# Patient Record
Sex: Male | Born: 1955 | Race: White | Hispanic: No | Marital: Single | Smoking: Never smoker
Health system: Southern US, Community
[De-identification: ages and names within clinical notes are randomized; demographics above are authoritative.]

## PROBLEM LIST (undated history)

## (undated) DIAGNOSIS — K5792 Diverticulitis of intestine, part unspecified, without perforation or abscess without bleeding: Secondary | ICD-10-CM

## (undated) DIAGNOSIS — M109 Gout, unspecified: Secondary | ICD-10-CM

## (undated) DIAGNOSIS — I10 Essential (primary) hypertension: Secondary | ICD-10-CM

## (undated) DIAGNOSIS — K659 Peritonitis, unspecified: Secondary | ICD-10-CM

## (undated) HISTORY — PX: OTHER SURGICAL HISTORY: SHX169

## (undated) HISTORY — PX: ABDOMINAL SURGERY: SHX537

## (undated) HISTORY — PX: REVISION COLOSTOMY: SHX1039

## (undated) HISTORY — PX: COLOSTOMY: SHX63

---

## 2004-12-24 ENCOUNTER — Ambulatory Visit (HOSPITAL_COMMUNITY): Admission: RE | Admit: 2004-12-24 | Discharge: 2004-12-24 | Payer: Self-pay | Admitting: Gastroenterology

## 2012-01-26 ENCOUNTER — Other Ambulatory Visit: Payer: Self-pay | Admitting: Family Medicine

## 2012-01-27 ENCOUNTER — Ambulatory Visit
Admission: RE | Admit: 2012-01-27 | Discharge: 2012-01-27 | Disposition: A | Discharge status: Home / Self Care | Payer: PRIVATE HEALTH INSURANCE | Source: Ambulatory Visit | Attending: Family Medicine | Admitting: Family Medicine

## 2012-01-27 MED ORDER — IOHEXOL 300 MG/ML  SOLN
100.0000 mL | Freq: Once | INTRAMUSCULAR | Status: AC | PRN
  Administered 2012-01-27: 125 mL via INTRAVENOUS

## 2012-02-07 ENCOUNTER — Inpatient Hospital Stay (HOSPITAL_COMMUNITY)
Admission: EM | Admit: 2012-02-07 | Discharge: 2012-02-11 | DRG: 603 | Disposition: A | Discharge status: Home Health | Payer: PRIVATE HEALTH INSURANCE | Attending: Surgery | Admitting: Surgery

## 2012-02-07 ENCOUNTER — Observation Stay (HOSPITAL_COMMUNITY): Payer: PRIVATE HEALTH INSURANCE

## 2012-02-07 ENCOUNTER — Encounter (HOSPITAL_COMMUNITY): Payer: Self-pay | Admitting: *Deleted

## 2012-02-07 DIAGNOSIS — T148XXA Other injury of unspecified body region, initial encounter: Secondary | ICD-10-CM

## 2012-02-07 DIAGNOSIS — Z9049 Acquired absence of other specified parts of digestive tract: Secondary | ICD-10-CM

## 2012-02-07 DIAGNOSIS — L03319 Cellulitis of trunk, unspecified: Secondary | ICD-10-CM

## 2012-02-07 DIAGNOSIS — Z79899 Other long term (current) drug therapy: Secondary | ICD-10-CM

## 2012-02-07 DIAGNOSIS — M109 Gout, unspecified: Secondary | ICD-10-CM

## 2012-02-07 DIAGNOSIS — I10 Essential (primary) hypertension: Secondary | ICD-10-CM

## 2012-02-07 DIAGNOSIS — L02219 Cutaneous abscess of trunk, unspecified: Principal | ICD-10-CM | POA: Diagnosis present

## 2012-02-07 DIAGNOSIS — K632 Fistula of intestine: Secondary | ICD-10-CM | POA: Diagnosis present

## 2012-02-07 DIAGNOSIS — Z9889 Other specified postprocedural states: Secondary | ICD-10-CM

## 2012-02-07 DIAGNOSIS — Z8719 Personal history of other diseases of the digestive system: Secondary | ICD-10-CM

## 2012-02-07 HISTORY — DX: Diverticulitis of intestine, part unspecified, without perforation or abscess without bleeding: K57.92

## 2012-02-07 HISTORY — DX: Gout, unspecified: M10.9

## 2012-02-07 HISTORY — DX: Essential (primary) hypertension: I10

## 2012-02-07 HISTORY — DX: Peritonitis, unspecified: K65.9

## 2012-02-07 LAB — BASIC METABOLIC PANEL
Chloride: 100 mEq/L (ref 96–112)
GFR calc Af Amer: >90 mL/min (ref >90)
GFR calc non Af Amer: >90 mL/min (ref >90)
Glucose, Bld: 94 mg/dL (ref 70–99)
Potassium: 4.6 mEq/L (ref 3.5–5.1)
Sodium: 138 mEq/L (ref 135–145)

## 2012-02-07 LAB — DIFFERENTIAL
Basophils Relative: 1 % (ref 0–1)
Lymphocytes Relative: 27 % (ref 12–46)
Lymphs Abs: 1.7 10*3/uL (ref 0.7–4.0)
Monocytes Relative: 10 % (ref 3–12)
Neutro Abs: 3.7 10*3/uL (ref 1.7–7.7)
Neutrophils Relative %: 58 % (ref 43–77)

## 2012-02-07 LAB — URINALYSIS, ROUTINE W REFLEX MICROSCOPIC
Glucose, UA: NEGATIVE mg/dL
Hgb urine dipstick: NEGATIVE
Specific Gravity, Urine: 1.019 (ref 1.005–1.030)
Urobilinogen, UA: 1 mg/dL (ref 0.0–1.0)
pH: 7 (ref 5.0–8.0)

## 2012-02-07 LAB — CBC
HCT: 30.4 % — ABNORMAL LOW (ref 39.0–52.0)
Hemoglobin: 10.8 g/dL — ABNORMAL LOW (ref 13.0–17.0)
RBC: 3.2 MIL/uL — ABNORMAL LOW (ref 4.22–5.81)
WBC: 6.3 10*3/uL (ref 4.0–10.5)

## 2012-02-07 MED ORDER — SODIUM CHLORIDE 0.9 % IV SOLN
3.0000 g | Freq: Once | INTRAVENOUS | Status: AC
  Administered 2012-02-07: 3 g via INTRAVENOUS
  Filled 2012-02-07: qty 3

## 2012-02-07 MED ORDER — ACETAMINOPHEN 325 MG PO TABS: 650.0000 mg | ORAL_TABLET | Freq: Four times a day (QID) | ORAL | Status: DC | PRN

## 2012-02-07 MED ORDER — DIPHENHYDRAMINE HCL 12.5 MG/5ML PO ELIX: 12.5000 mg | ORAL_SOLUTION | Freq: Four times a day (QID) | ORAL | Status: DC | PRN

## 2012-02-07 MED ORDER — ALPRAZOLAM 0.25 MG PO TABS
0.2500 mg | ORAL_TABLET | Freq: Every evening | ORAL | Status: DC | PRN
  Administered 2012-02-10 – 2012-02-11 (×2): 0.25 mg via ORAL
  Filled 2012-02-07 (×2): qty 1

## 2012-02-07 MED ORDER — OXYCODONE HCL 5 MG PO TABS
5.0000 mg | ORAL_TABLET | ORAL | Status: DC | PRN
  Administered 2012-02-08 – 2012-02-11 (×7): 5 mg via ORAL
  Filled 2012-02-07 (×6): qty 1

## 2012-02-07 MED ORDER — METOPROLOL SUCCINATE ER 50 MG PO TB24
50.0000 mg | ORAL_TABLET | Freq: Every day | ORAL | Status: DC
  Administered 2012-02-08 – 2012-02-09 (×2): 50 mg via ORAL
  Filled 2012-02-07 (×2): qty 1

## 2012-02-07 MED ORDER — DIPHENHYDRAMINE HCL 50 MG/ML IJ SOLN
12.5000 mg | Freq: Four times a day (QID) | INTRAMUSCULAR | Status: DC | PRN
  Administered 2012-02-08: 25 mg via INTRAVENOUS
  Filled 2012-02-07: qty 1

## 2012-02-07 MED ORDER — HEPARIN SODIUM (PORCINE) 5000 UNIT/ML IJ SOLN
5000.0000 [IU] | Freq: Three times a day (TID) | INTRAMUSCULAR | Status: DC
  Administered 2012-02-07 – 2012-02-09 (×6): 5000 [IU] via SUBCUTANEOUS
  Filled 2012-02-07 (×8): qty 1

## 2012-02-07 MED ORDER — HYDROMORPHONE HCL PF 1 MG/ML IJ SOLN
1.0000 mg | Freq: Once | INTRAMUSCULAR | Status: AC
  Administered 2012-02-07: 1 mg via INTRAVENOUS
  Filled 2012-02-07: qty 1

## 2012-02-07 MED ORDER — ONDANSETRON HCL 4 MG/2ML IJ SOLN: 4.0000 mg | Freq: Four times a day (QID) | INTRAMUSCULAR | Status: DC | PRN

## 2012-02-07 MED ORDER — ACETAMINOPHEN 650 MG RE SUPP: 650.0000 mg | Freq: Four times a day (QID) | RECTAL | Status: DC | PRN

## 2012-02-07 MED ORDER — PIPERACILLIN-TAZOBACTAM 3.375 G IVPB
3.3750 g | Freq: Three times a day (TID) | INTRAVENOUS | Status: DC
  Administered 2012-02-07 – 2012-02-11 (×11): 3.375 g via INTRAVENOUS
  Filled 2012-02-07 (×13): qty 50

## 2012-02-07 MED ORDER — KCL IN DEXTROSE-NACL 20-5-0.45 MEQ/L-%-% IV SOLN
INTRAVENOUS | Status: DC
  Administered 2012-02-07: 1000 mL via INTRAVENOUS
  Filled 2012-02-07 (×5): qty 1000

## 2012-02-07 MED ORDER — OXYCODONE-ACETAMINOPHEN 10-325 MG PO TABS: 1.0000 | ORAL_TABLET | ORAL | Status: DC | PRN

## 2012-02-07 MED ORDER — ALLOPURINOL 100 MG PO TABS
100.0000 mg | ORAL_TABLET | Freq: Every day | ORAL | Status: DC
  Administered 2012-02-08 – 2012-02-09 (×2): 100 mg via ORAL
  Filled 2012-02-07 (×2): qty 1

## 2012-02-07 MED ORDER — ONDANSETRON HCL 4 MG/2ML IJ SOLN: 4.0000 mg | Freq: Once | INTRAMUSCULAR | Status: DC

## 2012-02-07 MED ORDER — LISINOPRIL 20 MG PO TABS
20.0000 mg | ORAL_TABLET | Freq: Every day | ORAL | Status: DC
  Administered 2012-02-08 – 2012-02-09 (×2): 20 mg via ORAL
  Filled 2012-02-07 (×2): qty 1

## 2012-02-07 MED ORDER — HYDROMORPHONE HCL PF 1 MG/ML IJ SOLN: 1.0000 mg | Freq: Once | INTRAMUSCULAR | Status: AC

## 2012-02-07 MED ORDER — OXYCODONE-ACETAMINOPHEN 5-325 MG PO TABS
1.0000 | ORAL_TABLET | ORAL | Status: DC | PRN
  Administered 2012-02-08 – 2012-02-11 (×8): 1 via ORAL
  Filled 2012-02-07 (×7): qty 1

## 2012-02-07 MED ORDER — HYDROMORPHONE HCL PF 1 MG/ML IJ SOLN
1.0000 mg | INTRAMUSCULAR | Status: DC | PRN
  Administered 2012-02-07 – 2012-02-09 (×11): 1 mg via INTRAVENOUS
  Filled 2012-02-07 (×11): qty 1

## 2012-02-07 NOTE — ED Provider Notes (Signed)
History     CSN: [id number]  Arrival date & time 02/07/12  1452   First MD Initiated Contact with Patient 02/07/12 1702      Chief Complaint  Patient presents with  . Wound Infection    (Consider location/radiation/quality/duration/timing/severity/associated sxs/prior treatment) The history is provided by the patient and the spouse. No language interpreter was used.  cc:  Patient is here today complaining of a left lower abdominal wound infection. Patient has had several abdominal surgeries including surgery for diverticulitis with colostomy and hernia surgeries. Patient had his surgery done in [place] per Dr. Bascom Levels at [facility] the area in question. Patient states that the left lower quadrant scarring started draining 3 days ago and the CT a week ago shows that there is a mass in the scar tissue. There is area of cellulitis around the old colostomy site. States that his PCP Dr. Clarene Duke and Daleen Squibb has been managing his infections for the last 4 years. States that he got a shot of something in the office today antibiotic but not sure what was in the shot.  Patient's wife states that Dr. Clarene Duke called and talked to Dr. Daphine Deutscher to come in the ER and put a drain in the infected area.    Past Medical History  Diagnosis Date  . Diverticulitis   . Hypertension   . Gout     Past Surgical History  Procedure Date  . Revision colostomy     No family history on file.  History  Substance Use Topics  . Smoking status: Never Smoker   . Smokeless tobacco: Not on file  . Alcohol Use: 7.2 oz/week    12 Cans of beer per week      Review of Systems  Constitutional: Negative.  Negative for fever.  HENT: Negative.   Eyes: Negative.   Respiratory: Negative.   Cardiovascular: Negative.   Gastrointestinal: Positive for nausea. Negative for vomiting and diarrhea.       RLQ wound/cellulites   Neurological: Negative.   Psychiatric/Behavioral: Negative.   All other  systems reviewed and are negative.    Allergies  Review of patient's allergies indicates no known allergies.  Home Medications   Current Outpatient Rx  Name Route Sig Dispense Refill  . ALLOPURINOL 300 MG PO TABS Oral Take 100 mg by mouth daily.    Marland Kitchen ALPRAZOLAM 0.25 MG PO TABS Oral Take 0.25 mg by mouth at bedtime as needed. Anxiety    . LISINOPRIL 20 MG PO TABS Oral Take 20 mg by mouth daily.    Marland Kitchen METOPROLOL SUCCINATE ER 50 MG PO TB24 Oral Take 50 mg by mouth daily. Take with or immediately following a meal.    . ADULT MULTIVITAMIN W/MINERALS CH Oral Take 1 tablet by mouth daily.    . OXYCODONE-ACETAMINOPHEN 10-325 MG PO TABS Oral Take 1 tablet by mouth every 4 (four) hours as needed. Pain      BP 121/81  Pulse 76  Temp(Src) 97.5 F (36.4 C) (Oral)  Resp 16  Wt 230 lb (104.327 kg)  SpO2 97%  Physical Exam  Nursing note and vitals reviewed. Constitutional: He is oriented to person, place, and time. He appears well-developed and well-nourished.  HENT:  Head: Normocephalic.  Eyes: Conjunctivae and EOM are normal. Pupils are equal, round, and reactive to light.  Neck: Normal range of motion. Neck supple.  Cardiovascular: Normal rate.   Pulmonary/Chest: Effort normal and breath sounds normal.  Abdominal: Soft. Bowel sounds are normal. He exhibits  mass. He exhibits no distension. There is tenderness.  Musculoskeletal: Normal range of motion.  Neurological: He is alert and oriented to person, place, and time.  Skin: Skin is warm and dry.       RLQ wound drainage cellulitis from old colostomy site foul odor  Psychiatric: He has a normal mood and affect.    ED Course  Procedures (including critical care time) 6:18 PM Shared visit with Dr. Juleen China who spoke with Dr. Daphine Deutscher. The patient will be seen in the ER by Dr. Daphine Deutscher on for surgery in the next hour.  Will culture wound.  Cellulitis outlined.      Labs Reviewed  CBC - Abnormal; Notable for the following:    RBC 3.20 (*)     Hemoglobin 10.8 (*)    HCT 30.4 (*)    All other components within normal limits  DIFFERENTIAL  URINALYSIS, ROUTINE W REFLEX MICROSCOPIC  BASIC METABOLIC PANEL   No results found.   No diagnosis found.    MDM  LLQ wound that is chronic and worsened 3 days ago.  Serosanguinous drainage cultured.  Dr. Daphine Deutscher to see patient in ER for possible procedure.  Unasyn 3gm iv in the ER.  Dilaudid for pain.  Odorous.  Will put in admission orders if requested.    Labs Reviewed  CBC - Abnormal; Notable for the following:    RBC 3.20 (*)    Hemoglobin 10.8 (*)    HCT 30.4 (*)    All other components within normal limits  DIFFERENTIAL  BASIC METABOLIC PANEL  URINALYSIS, ROUTINE W REFLEX MICROSCOPIC  WOUND CULTURE          Remi Haggard, NP 02/07/12 2128

## 2012-02-07 NOTE — H&P (Signed)
Chief Complaint:  Left lower quadrant cellulitis at site of previous ostomy  History of Present Illness:  [name] is an 56 y.o. male who is followed by Dr. Rosanne Ashing little in [place] and who has a history of diverticulitis in years past treated with a Hartmann procedure followed by closure of his ostomy and then following with closure of his ostomy hernia with mesh. This last procedure was done down at [facility]. This was several years ago.  And may the patient started noticing a mass at the site of his ostomy that was firm and which he was seen by Dr. Clarene Duke and a CT scan was performed. This was reviewed by me and I would tend to disagree with the findings. He does have small bowel with contrast intimately associated with this region and in light of the subsequent behavior with what now is cellulitis and abscess I am concerned he may be developing a fistula. I probed this wound in the ED and it went in about an inch and was opened and contained frank pus.  The possibility cultured and Dr. Fredirick Maudlin office earlier. Eyes discussed options with Mr. Mellin and recommended IV antibiotics repeat CT scan to see if we can demonstrate any early fistula formation to the mesh.  Past Medical History  Diagnosis Date  . Diverticulitis   . Hypertension   . Gout     Past Surgical History  Procedure Date  . Revision colostomy     Current Facility-Administered Medications  Medication Dose Route Frequency Provider Last Rate Last Dose  . Ampicillin-Sulbactam (UNASYN) 3 g in sodium chloride 0.9 % 100 mL IVPB  3 g Intravenous Once Remi Haggard, NP   3 g at 02/07/12 1757  . HYDROmorphone (DILAUDID) injection 1 mg  1 mg Intravenous Once Alba Cory, MD   1 mg at 02/07/12 1755  . HYDROmorphone (DILAUDID) injection 1 mg  1 mg Intravenous Once Remi Haggard, NP      . ondansetron [facility]) injection 4 mg  4 mg Intravenous Once Remi Haggard, NP       Current Outpatient Prescriptions  Medication  Sig Dispense Refill  . allopurinol (ZYLOPRIM) 300 MG tablet Take 100 mg by mouth daily.      Marland Kitchen ALPRAZolam (XANAX) 0.25 MG tablet Take 0.25 mg by mouth at bedtime as needed. Anxiety      . lisinopril (PRINIVIL,ZESTRIL) 20 MG tablet Take 20 mg by mouth daily.      . metoprolol succinate (TOPROL-XL) 50 MG 24 hr tablet Take 50 mg by mouth daily. Take with or immediately following a meal.      . Multiple Vitamin (MULTIVITAMIN WITH MINERALS) TABS Take 1 tablet by mouth daily.      Marland Kitchen oxyCODONE-acetaminophen (PERCOCET) 10-325 MG per tablet Take 1 tablet by mouth every 4 (four) hours as needed. Pain       Review of patient's allergies indicates no known allergies. No family history on file. Social History:   reports that he has never smoked. He does not have any smokeless tobacco history on file. He reports that he drinks about 7.2 ounces of alcohol per week. He reports that he does not use illicit drugs.   REVIEW OF SYSTEMS - PERTINENT POSITIVES ONLY: Noncontributory  Physical Exam:   Blood pressure 121/81, pulse 76, temperature 97.5 F (36.4 C), temperature source Oral, resp. rate 16, weight 230 lb (104.327 kg), SpO2 97.00%. There is no height on file to calculate BMI.  Gen:  WDWN WHITE  male NAD  Neurological: Alert and oriented to person, place, and time. Motor and sensory function is grossly intact  Head: Normocephalic and atraumatic.  Eyes: Conjunctivae are normal. Pupils are equal, round, and reactive to light. No scleral icterus.  Neck: Normal range of motion. Neck supple. No tracheal deviation or thyromegaly present.  Cardiovascular:  SR without murmurs or gallops.  No carotid bruits Respiratory: Effort normal.  No respiratory distress. No chest wall tenderness. Breath sounds normal.  No wheezes, rales or rhonchi.  Abdomen:  There is an area of edema in the old transverse incision an area of cellulitis surrounding that. Laterally there are 2 areas of this is opened and did appear to be  draining well. No crepitus is noted around this and it does not appear to be any gas coming out of his the present time. GU: Musculoskeletal: Normal range of motion. Extremities are nontender. No cyanosis, edema or clubbing noted Lymphadenopathy: No cervical, preauricular, postauricular or axillary adenopathy is present Skin: Skin is warm and dry. No rash noted. No diaphoresis. No erythema. No pallor. Pscyh: Normal mood and affect. Behavior is normal. Judgment and thought content normal.   LABORATORY RESULTS: Results for orders placed during the hospital encounter of 02/07/12 (from the past 48 hour(s))  CBC     Status: Abnormal   Collection Time   02/07/12  5:05 PM      Component Value Range Comment   WBC 6.3  4.0 - 10.5 (K/uL)    RBC 3.20 (*) 4.22 - 5.81 (MIL/uL)    Hemoglobin 10.8 (*) 13.0 - 17.0 (g/dL)    HCT 16.1 (*) 09.6 - 52.0 (%)    MCV 95.0  78.0 - 100.0 (fL)    MCH 33.8  26.0 - 34.0 (pg)    MCHC 35.5  30.0 - 36.0 (g/dL)    RDW 04.5  40.9 - 81.1 (%)    Platelets 249  150 - 400 (K/uL)   DIFFERENTIAL     Status: Normal   Collection Time   02/07/12  5:05 PM      Component Value Range Comment   Neutrophils Relative 58  43 - 77 (%)    Neutro Abs 3.7  1.7 - 7.7 (K/uL)    Lymphocytes Relative 27  12 - 46 (%)    Lymphs Abs 1.7  0.7 - 4.0 (K/uL)    Monocytes Relative 10  3 - 12 (%)    Monocytes Absolute 0.6  0.1 - 1.0 (K/uL)    Eosinophils Relative 4  0 - 5 (%)    Eosinophils Absolute 0.3  0.0 - 0.7 (K/uL)    Basophils Relative 1  0 - 1 (%)    Basophils Absolute 0.0  0.0 - 0.1 (K/uL)   BASIC METABOLIC PANEL     Status: Normal   Collection Time   02/07/12  5:05 PM      Component Value Range Comment   Sodium 138  135 - 145 (mEq/L)    Potassium 4.6  3.5 - 5.1 (mEq/L)    Chloride 100  96 - 112 (mEq/L)    CO2 29  19 - 32 (mEq/L)    Glucose, Bld 94  70 - 99 (mg/dL)    BUN 16  6 - 23 (mg/dL)    Creatinine, Ser 9.14  0.50 - 1.35 (mg/dL)    Calcium 9.3  8.4 - 10.5 (mg/dL)    GFR  calc non Af Amer >90  >90 (mL/min)    GFR calc Af  Amer >90  >90 (mL/min)     RADIOLOGY RESULTS: No results found.  Problem List: Patient Active Problem List  Diagnoses  . H/O diverticulitis of colon-prior colectomcy, Hartman procedure, closure and repair of ostomy hernia with mesh in [facility]  . Gout  . Hypertension    Assessment & Plan: His white count is only 6300 and with no left shift. He is afebrile and not tachycardic. Despite this this area is red and infected. Will admit for IV antibiotics and repeat CT scan abdomen pelvis.    Matt B. Daphine Deutscher, MD, [facility] [phone] beeper [phone]  02/07/2012 7:35 PM

## 2012-02-07 NOTE — ED Notes (Signed)
Pt states "have had a lot of abdominal surgery, noticed a lot of red & swelling, had a CT done, it said there was a mass, no abscess"; pt presents with yellow drainage and redness to left lateral abdomen.

## 2012-02-08 ENCOUNTER — Observation Stay (HOSPITAL_COMMUNITY): Payer: PRIVATE HEALTH INSURANCE

## 2012-02-08 LAB — CBC
Hemoglobin: 10.2 g/dL — ABNORMAL LOW (ref 13.0–17.0)
MCH: 32.9 pg (ref 26.0–34.0)
RBC: 3.1 MIL/uL — ABNORMAL LOW (ref 4.22–5.81)

## 2012-02-08 MED ORDER — IOHEXOL 300 MG/ML  SOLN
100.0000 mL | Freq: Once | INTRAMUSCULAR | Status: AC | PRN
  Administered 2012-02-08: 100 mL via INTRAVENOUS

## 2012-02-08 NOTE — Progress Notes (Addendum)
  Subjective: Appear comfortable in bed finished contrast.  Objective: Vital signs in last 24 hours: Temp:  [97.4 F (36.3 C)-98.1 F (36.7 C)] 97.4 F (36.3 C) (06/11 0746) Pulse Rate:  [58-76] 63  (06/11 0746) Resp:  [16-18] 16  (06/11 0746) BP: (121-160)/(81-93) 154/93 mmHg (06/11 0746) SpO2:  [96 %-100 %] 96 % (06/11 0746) Weight:  [104.327 kg (230 lb)] 104.327 kg (230 lb) (06/11 0130) Last BM Date: 02/07/12  Afebrile, BP up some, WBC is normal  Intake/Output from previous day:   Intake/Output this shift: Total I/O In: -  Out: 500 [Urine:500]  General appearance: alert, cooperative and no distress GI: cellulitis with open area in the mid portion of the old ostomy site, with purulent drainage.  Lab Results:   Basename 02/08/12 0411 02/07/12 1705  WBC 5.5 6.3  HGB 10.2* 10.8*  HCT 29.7* 30.4*  PLT 210 249    BMET  Basename 02/07/12 1705  NA 138  K 4.6  CL 100  CO2 29  GLUCOSE 94  BUN 16  CREATININE 0.82  CALCIUM 9.3   PT/INR No results found for this basename: LABPROT:2,INR:2 in the last 72 hours  No results found for this basename: AST:5,ALT:5,ALKPHOS:5,BILITOT:5,PROT:5,ALBUMIN:5 in the last 168 hours   Lipase  No results found for this basename: lipase     Studies/Results: No results found.  Medications:    . allopurinol  100 mg Oral Daily  . ampicillin-sulbactam (UNASYN) IV  3 g Intravenous Once  . heparin  5,000 Units Subcutaneous Q8H  .  HYDROmorphone (DILAUDID) injection  1 mg Intravenous Once  .  HYDROmorphone (DILAUDID) injection  1 mg Intravenous Once  .  HYDROmorphone (DILAUDID) injection  1 mg Intravenous Once  . lisinopril  20 mg Oral Daily  . metoprolol succinate  50 mg Oral Daily  . ondansetron (ZOFRAN) IV  4 mg Intravenous Once  . piperacillin-tazobactam (ZOSYN)  IV  3.375 g Intravenous Q8H    Assessment/Plan Possible abscess at old Ostomy site Hx of diverticulitis/Hartmans/closure of ostomy/ostomy  Hernia with mesh  repair. [facility]. Hypertension  Gout  Plan: CT is pending, it looks like it may need to be opened.      LOS: 1 day    [name],[name] 02/08/2012  PCP - Dr. Shela Commons. Little CT scan - questionable fistula.  Left inguinal hernia. The patient had a colectomy with ostomy in 2005.  He had a reversal of the ostomy the same year.  He then had a hernia at the ostomy which was fixed with mesh (?laparoscopically?) in 2006.  All surgery was done in [facility] [place]. The surgery was done by Dr. Tonette Bihari.  We have requested records from [facility]. His life partner, [name], is in the room. Patient with 7+ cm area of cellulitis at the area of his colostomy in the left abdomen. He will need I&D left abdomen and/or local exploration. I gave patient copy of CT scan and explained possible fistula secondary to mesh (as worst possibility).  Scott Kin, MD, [facility] Pager: [phone] Office phone:  [phone]

## 2012-02-08 NOTE — ED Provider Notes (Signed)
Medical screening examination/treatment/procedure(s) were conducted as a shared visit with non-physician practitioner(s) and myself.  I personally evaluated the patient during the encounter.  Patient with abdominal wound to left lower quadrant surrounding cellulitis. Recent CT on May 30 for mass below this. No drainable collection there is no evidence of incarceration. Given history of prior resection consider possible fistula. Surgery was consult who evaluated patient in the emergency room and admitted.  Raeford Razor, MD 02/08/12 [phone]

## 2012-02-08 NOTE — Progress Notes (Signed)
UR complete 

## 2012-02-08 NOTE — Progress Notes (Signed)
An increase in drainage noted and dressing changed a second time  (wet-to-dry with Normal Saline). Dressing change was tolerated by patient.

## 2012-02-08 NOTE — Progress Notes (Signed)
INITIAL ADULT NUTRITION ASSESSMENT Date: 02/08/2012   Time: 1:27 PM Reason for Assessment: Nutrition risk   ASSESSMENT: Male 56 y.o.  Dx: Left lower quadrant cellulitis at site of previous ostomy  Food/Nutrition Related Hx: Pt with history of colostomy with takedown. Pt reports great appetite PTA, eating 3 meals/day. Pt aware of what foods he can't eat that can irritate his diverticulitis and has also been following a low sodium diet in an effort to improve his blood pressure. He reports 10 pound intentional weight loss in the past 6 weeks r/t eating healthier. Pt denies any diarrhea or constipation PTA. CT of abdomen/pelvis today showed possibility of tiny fistula. Awaiting decision on surgery.   Hx:  Past Medical History  Diagnosis Date  . Diverticulitis   . Hypertension   . Gout   . Peritonitis    Past Surgical History  Procedure Date  . Revision colostomy   . Colostomy   . Diverticulum surgery     x2 , with colostomy on 2nd surgery  . Abdominal surgery     stoma herniated and had surgery on it     Related Meds:  Scheduled Meds:   . allopurinol  100 mg Oral Daily  . ampicillin-sulbactam (UNASYN) IV  3 g Intravenous Once  . heparin  5,000 Units Subcutaneous Q8H  .  HYDROmorphone (DILAUDID) injection  1 mg Intravenous Once  .  HYDROmorphone (DILAUDID) injection  1 mg Intravenous Once  .  HYDROmorphone (DILAUDID) injection  1 mg Intravenous Once  . lisinopril  20 mg Oral Daily  . metoprolol succinate  50 mg Oral Daily  . ondansetron (ZOFRAN) IV  4 mg Intravenous Once  . piperacillin-tazobactam (ZOSYN)  IV  3.375 g Intravenous Q8H   Continuous Infusions:   . dextrose 5 % and 0.45 % NaCl with KCl 20 mEq/L 1,000 mL (02/07/12 2214)   PRN Meds:.acetaminophen, acetaminophen, ALPRAZolam, diphenhydrAMINE, diphenhydrAMINE, HYDROmorphone (DILAUDID) injection, iohexol, ondansetron, oxyCODONE, oxyCODONE-acetaminophen, DISCONTD: oxyCODONE-acetaminophen  Ht: 185.4 cm (6'  1'')  Wt: 230 lb (104.327 kg)  Ideal Wt: 184 lb % Ideal Wt: 125  Usual Wt: 240 lb % Usual Wt: 96  Body mass index is 30.34 kg/(m^2). Class I obesity   Labs:  CMP     Component Value Date/Time   NA 138 02/07/2012 1705   K 4.6 02/07/2012 1705   CL 100 02/07/2012 1705   CO2 29 02/07/2012 1705   GLUCOSE 94 02/07/2012 1705   BUN 16 02/07/2012 1705   CREATININE 0.82 02/07/2012 1705   CALCIUM 9.3 02/07/2012 1705   GFRNONAA >90 02/07/2012 1705   GFRAA >90 02/07/2012 1705    Intake/Output Summary (Last 24 hours) at 02/08/12 1335 Last data filed at 02/08/12 0730  Gross per 24 hour  Intake      0 ml  Output    500 ml  Net   -500 ml   Last BM - 02/07/12  Diet Order:  NPO   IVF:    dextrose 5 % and 0.45 % NaCl with KCl 20 mEq/L Last Rate: 1,000 mL (02/07/12 2214)    Estimated Nutritional Needs:   Kcal: 1610-9604 Protein: 85-100g  Fluid: 2.1-2.4L  NUTRITION DIAGNOSIS: -Inadequate oral intake (NI-2.1).  Status: Ongoing  RELATED TO: concern for fistula   AS EVIDENCE BY: NPO  MONITORING/EVALUATION(Goals): Advance diet as tolerated to regular diet   EDUCATION NEEDS: -No education needs identified at this time  INTERVENTION: Diet advancement per MD. Will monitor.   Dietitian #: [phone]  DOCUMENTATION CODES  Per approved criteria  -Obesity Unspecified    Marshall Cork 02/08/2012, 1:27 PM

## 2012-02-09 ENCOUNTER — Inpatient Hospital Stay (HOSPITAL_COMMUNITY): Payer: PRIVATE HEALTH INSURANCE | Admitting: *Deleted

## 2012-02-09 ENCOUNTER — Encounter (HOSPITAL_COMMUNITY): Payer: Self-pay | Admitting: *Deleted

## 2012-02-09 ENCOUNTER — Encounter (HOSPITAL_COMMUNITY): Admission: EM | Disposition: A | Discharge status: Home Health | Payer: Self-pay | Source: Home / Self Care

## 2012-02-09 HISTORY — PX: INCISE AND DRAIN ABCESS: PRO64

## 2012-02-09 LAB — SURGICAL PCR SCREEN
MRSA, PCR: NEGATIVE
Staphylococcus aureus: NEGATIVE

## 2012-02-09 SURGERY — INCISION AND DRAINAGE, ABSCESS
Anesthesia: General | Site: Abdomen | Wound class: Dirty or Infected

## 2012-02-09 MED ORDER — ACETAMINOPHEN 10 MG/ML IV SOLN
INTRAVENOUS | Status: DC | PRN
  Administered 2012-02-09: 1000 mg via INTRAVENOUS

## 2012-02-09 MED ORDER — HYDROMORPHONE HCL PF 1 MG/ML IJ SOLN
0.2500 mg | INTRAMUSCULAR | Status: DC | PRN
  Administered 2012-02-09 (×4): 0.5 mg via INTRAVENOUS

## 2012-02-09 MED ORDER — FENTANYL CITRATE 0.05 MG/ML IJ SOLN
INTRAMUSCULAR | Status: DC | PRN
  Administered 2012-02-09: 50 ug via INTRAVENOUS
  Administered 2012-02-09: 100 ug via INTRAVENOUS
  Administered 2012-02-09 (×2): 50 ug via INTRAVENOUS

## 2012-02-09 MED ORDER — LACTATED RINGERS IV SOLN
INTRAVENOUS | Status: DC | PRN
  Administered 2012-02-09 (×2): via INTRAVENOUS

## 2012-02-09 MED ORDER — CISATRACURIUM BESYLATE (PF) 10 MG/5ML IV SOLN
INTRAVENOUS | Status: DC | PRN
  Administered 2012-02-09: 4 mg via INTRAVENOUS

## 2012-02-09 MED ORDER — PROPOFOL 10 MG/ML IV EMUL
INTRAVENOUS | Status: DC | PRN
  Administered 2012-02-09: 200 mg via INTRAVENOUS

## 2012-02-09 MED ORDER — SUCCINYLCHOLINE CHLORIDE 20 MG/ML IJ SOLN
INTRAMUSCULAR | Status: DC | PRN
  Administered 2012-02-09: 120 mg via INTRAVENOUS

## 2012-02-09 MED ORDER — LACTATED RINGERS IV SOLN: INTRAVENOUS | Status: DC

## 2012-02-09 MED ORDER — ONDANSETRON HCL 4 MG/2ML IJ SOLN
INTRAMUSCULAR | Status: DC | PRN
  Administered 2012-02-09: 4 mg via INTRAVENOUS

## 2012-02-09 MED ORDER — MIDAZOLAM HCL 5 MG/5ML IJ SOLN
INTRAMUSCULAR | Status: DC | PRN
  Administered 2012-02-09 (×4): 1 mg via INTRAVENOUS

## 2012-02-09 MED ORDER — SUFENTANIL CITRATE 50 MCG/ML IV SOLN
INTRAVENOUS | Status: DC | PRN
  Administered 2012-02-09 (×2): 10 ug via INTRAVENOUS

## 2012-02-09 MED ORDER — LIDOCAINE HCL (CARDIAC) 20 MG/ML IV SOLN
INTRAVENOUS | Status: DC | PRN
  Administered 2012-02-09: 75 mg via INTRAVENOUS

## 2012-02-09 SURGICAL SUPPLY — 53 items
APPLICATOR COTTON TIP 6IN STRL (MISCELLANEOUS) IMPLANT
BANDAGE GAUZE ELAST BULKY 4 IN (GAUZE/BANDAGES/DRESSINGS) ×3 IMPLANT
BLADE EXTENDED COATED 6.5IN (ELECTRODE) IMPLANT
BLADE HEX COATED 2.75 (ELECTRODE) ×3 IMPLANT
BLADE SURG 15 STRL LF DISP TIS (BLADE) ×2 IMPLANT
BLADE SURG 15 STRL SS (BLADE) ×2
CANISTER SUCTION 2500CC (MISCELLANEOUS) ×3 IMPLANT
CLOTH BEACON ORANGE TIMEOUT ST (SAFETY) ×3 IMPLANT
COVER MAYO STAND STRL (DRAPES) IMPLANT
COVER SURGICAL LIGHT HANDLE (MISCELLANEOUS) ×3 IMPLANT
DECANTER SPIKE VIAL GLASS SM (MISCELLANEOUS) IMPLANT
DRAPE LAPAROSCOPIC ABDOMINAL (DRAPES) ×3 IMPLANT
DRAPE WARM FLUID 44X44 (DRAPE) IMPLANT
DRSG PAD ABDOMINAL 8X10 ST (GAUZE/BANDAGES/DRESSINGS) ×6 IMPLANT
ELECT CAUTERY BLADE 6.4 (BLADE) ×3 IMPLANT
ELECT REM PT RETURN 9FT ADLT (ELECTROSURGICAL) ×3
ELECTRODE REM PT RTRN 9FT ADLT (ELECTROSURGICAL) ×2 IMPLANT
GLOVE BIO SURGEON STRL SZ7.5 (GLOVE) ×3 IMPLANT
GLOVE BIOGEL PI IND STRL 7.0 (GLOVE) ×2 IMPLANT
GLOVE BIOGEL PI INDICATOR 7.0 (GLOVE) ×1
GLOVE SURG ORTHO 8.0 STRL STRW (GLOVE) ×3 IMPLANT
GOWN STRL NON-REIN LRG LVL3 (GOWN DISPOSABLE) ×6 IMPLANT
GOWN STRL REIN XL XLG (GOWN DISPOSABLE) ×6 IMPLANT
KIT BASIN OR (CUSTOM PROCEDURE TRAY) ×3 IMPLANT
NEEDLE HYPO 25X1 1.5 SAFETY (NEEDLE) IMPLANT
NS IRRIG 1000ML POUR BTL (IV SOLUTION) ×3 IMPLANT
PACK GENERAL/GYN (CUSTOM PROCEDURE TRAY) ×3 IMPLANT
PENCIL BUTTON HOLSTER BLD 10FT (ELECTRODE) ×3 IMPLANT
SPONGE GAUZE 4X4 12PLY (GAUZE/BANDAGES/DRESSINGS) ×3 IMPLANT
SPONGE LAP 18X18 X RAY DECT (DISPOSABLE) ×3 IMPLANT
STAPLER VISISTAT 35W (STAPLE) IMPLANT
SUCTION POOLE TIP (SUCTIONS) IMPLANT
SUT MNCRL AB 4-0 PS2 18 (SUTURE) IMPLANT
SUT NOV 1 T60/GS (SUTURE) IMPLANT
SUT SILK 2 0 (SUTURE)
SUT SILK 2 0 SH CR/8 (SUTURE) IMPLANT
SUT SILK 2-0 18XBRD TIE 12 (SUTURE) IMPLANT
SUT SILK 3 0 (SUTURE)
SUT SILK 3 0 SH CR/8 (SUTURE) IMPLANT
SUT SILK 3-0 18XBRD TIE 12 (SUTURE) IMPLANT
SUT VIC AB 3-0 SH 27 (SUTURE)
SUT VIC AB 3-0 SH 27XBRD (SUTURE) IMPLANT
SUT VICRYL 2 0 18  UND BR (SUTURE)
SUT VICRYL 2 0 18 UND BR (SUTURE) IMPLANT
SWAB COLLECTION DEVICE MRSA (MISCELLANEOUS) IMPLANT
SYR BULB 3OZ (MISCELLANEOUS) IMPLANT
SYR CONTROL 10ML LL (SYRINGE) IMPLANT
TOWEL OR 17X26 10 PK STRL BLUE (TOWEL DISPOSABLE) ×6 IMPLANT
TRAY FOLEY CATH 14FRSI W/METER (CATHETERS) IMPLANT
TUBE ANAEROBIC SPECIMEN COL (MISCELLANEOUS) IMPLANT
WATER STERILE IRR 1000ML POUR (IV SOLUTION) IMPLANT
WATER STERILE IRR 1500ML POUR (IV SOLUTION) IMPLANT
YANKAUER SUCT BULB TIP NO VENT (SUCTIONS) IMPLANT

## 2012-02-09 NOTE — Anesthesia Postprocedure Evaluation (Signed)
  Anesthesia Post-op Note  Patient: [name]  Procedure(s) Performed: Procedure(s) (LRB): INCISION AND DRAINAGE ABSCESS (Left)  Patient Location: PACU  Anesthesia Type: General  Level of Consciousness: awake and alert   Airway and Oxygen Therapy: Patient Spontanous Breathing  Post-op Pain: mild  Post-op Assessment: Post-op Vital signs reviewed, Patient's Cardiovascular Status Stable, Respiratory Function Stable, Patent Airway and No signs of Nausea or vomiting  Post-op Vital Signs: stable  Complications: No apparent anesthesia complications 

## 2012-02-09 NOTE — Progress Notes (Signed)
Patient ID: [name], male   DOB: April 29, 1956, 56 y.o.   MRN: [id number]  General Surgery - [facility] - Progress Note  Subjective: Patient comfortable.  No complaints.  On IV Zosyn.  Dressing dry.  Objective: Vital signs in last 24 hours: Temp:  [97.7 F (36.5 C)-97.9 F (36.6 C)] 97.7 F (36.5 C) (06/12 0600) Pulse Rate:  [58-70] 60  (06/12 0600) Resp:  [16-18] 18  (06/12 0600) BP: (109-149)/(71-87) 123/74 mmHg (06/12 0600) SpO2:  [96 %-98 %] 98 % (06/12 0600) Last BM Date: 02/07/12  Intake/Output from previous day: 06/11 0701 - 06/12 0700 In: 2562.5 [P.O.:480; I.V.:2082.5] Out: 2875 [Urine:2875]  Exam: HEENT - clear, not icteric Neck - soft Chest - clear bilaterally Cor - RRR, no murmur Abd - soft without distension; LLQ dressing with small drainage; punctate opening in skin; moderate cellutlitis Ext - no significant edema Neuro - grossly intact, no focal deficits  Lab Results:   Basename 02/08/12 0411 02/07/12 1705  WBC 5.5 6.3  HGB 10.2* 10.8*  HCT 29.7* 30.4*  PLT 210 249     Basename 02/07/12 1705  NA 138  K 4.6  CL 100  CO2 29  GLUCOSE 94  BUN 16  CREATININE 0.82  CALCIUM 9.3    Studies/Results: Ct Abdomen Pelvis W Contrast  02/08/2012  *RADIOLOGY REPORT*  Clinical Data: Prior ostomy closure, question fistula from small bowel to match, history diverticulitis  CT ABDOMEN AND PELVIS WITH CONTRAST  Technique:  Multidetector CT imaging of the abdomen and pelvis was performed following the standard protocol during bolus administration of intravenous contrast. Sagittal and coronal MPR images reconstructed from axial data set.  Contrast: OMNIPAQUE IOHEXOL 300 MG/ML  SOLN Dilute oral contrast.  Comparison: 01/27/2012  Findings: Bibasilar atelectasis and small left pleural effusion. Liver, spleen, pancreas, kidneys, and right adrenal gland normal appearance. Minimally thickened and question slightly nodular left adrenal gland,  stable. Unremarkable bladder and ureters. Dilated left inguinal canal containing fat consistent with inguinal hernia.  Area of infiltrative change again identified in the left mid/lower abdomen at the anterior abdominal wall, similar to previous exam, question postoperative scarring versus inflammatory process/infection, changes minimally increased versus prior exam. Thin linear foci of high attenuation and gas seen at this site could potentially represent a tiny fistula though this does not extend to the skin surface. No discrete abscess collection identified. A single loop of small bowel demonstrates wall thickening and is associated with this site of infiltration.  Associated overlying skin thickening increased since previous exam. Diffuse colonic diverticulosis. Stomach and remaining small bowel loops otherwise unremarkable. No discrete abscess collection, free intraperitoneal air or free fluid. No acute osseous findings.  IMPRESSION: Tiny linear collection of high attenuation and gas is seen at the site of infiltration at the left mid/lower quadrant enter abdominal wall, could potentially represent a tiny fistula though this does not extend to the skin surface. The area of infiltration within the subcutaneous fat at the anterior abdominal wall in the left abdomen appears minimally increased since previous exam and is associated with increased overlying skin thickening. Associated thickening of a single small bowel loop deep to this site. No evidence of abscess or perforation. Scattered colonic diverticulosis. Left inguinal hernia.  Original Report Authenticated By: Lollie Marrow, M.D.    Assessment / Plan: 1.  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  The patient understands the proposed procedure, potential  alternative treatments, and the course of recovery to be expected.  All of the patient's questions have been answered at this time.  The patient wishes to proceed with surgery.  Velora Heckler, MD, [facility] Office: [phone]  02/09/2012

## 2012-02-09 NOTE — Brief Op Note (Signed)
Procedure(s): INCISION AND DRAINAGE ABSCESS Procedure Note  POPE BRUNTY male 56 y.o. 02/09/2012  Anesthesia: General endotracheal anesthesia   Surgeon(s) and Role:    * Lodema Pilot, DO - Primary   Indications:      Surgeon: Lodema Pilot DAVID   Assistants: none  Anesthesia: general  ASA Class:     Procedure Detail  INCISION AND DRAINAGE ABSCESS  Findings: Superficial abscess, ethibond suture removed  Estimated Blood Loss: minimal         Drains: wound packing         Total IV Fluids:  Blood Given: none         Specimens: abdominal wall tissue                Complications:  none         Disposition: pacu         Condition: stable

## 2012-02-09 NOTE — Transfer of Care (Signed)
Immediate Anesthesia Transfer of Care Note  Patient: [name]  Procedure(s) Performed: Procedure(s) (LRB): INCISION AND DRAINAGE ABSCESS (Left)  Patient Location: PACU  Anesthesia Type: General  Level of Consciousness: awake, alert , oriented and patient cooperative  Airway & Oxygen Therapy: Patient Spontanous Breathing and Patient connected to face mask oxygen  Post-op Assessment: Report given to PACU RN, Post -op Vital signs reviewed and stable and Patient moving all extremities  Post vital signs: Reviewed and stable  Complications: No apparent anesthesia complications

## 2012-02-09 NOTE — Anesthesia Preprocedure Evaluation (Signed)
Anesthesia Evaluation  Patient identified by MRN, date of birth, ID band Patient awake    Reviewed: Allergy & Precautions, H&P , NPO status , Patient's Chart, lab work & pertinent test results, reviewed documented beta blocker date and time   Airway Mallampati: II TM Distance: >3 FB Neck ROM: full    Dental No notable dental hx. (+) Teeth Intact and Dental Advisory Given   Pulmonary neg pulmonary ROS,  breath sounds clear to auscultation  Pulmonary exam normal       Cardiovascular Exercise Tolerance: Good hypertension, Pt. on medications and Pt. on home beta blockers negative cardio ROS  Rhythm:regular Rate:Normal     Neuro/Psych negative neurological ROS  negative psych ROS   GI/Hepatic negative GI ROS, Neg liver ROS,   Endo/Other  negative endocrine ROS  Renal/GU negative Renal ROS  negative genitourinary   Musculoskeletal   Abdominal   Peds  Hematology negative hematology ROS (+) Blood dyscrasia, anemia , Hgb. 10   Anesthesia Other Findings   Reproductive/Obstetrics negative OB ROS                           Anesthesia Physical Anesthesia Plan  ASA: II  Anesthesia Plan: General   Post-op Pain Management:    Induction: Intravenous  Airway Management Planned: Oral ETT  Additional Equipment:   Intra-op Plan:   Post-operative Plan: Extubation in OR  Informed Consent: I have reviewed the patients History and Physical, chart, labs and discussed the procedure including the risks, benefits and alternatives for the proposed anesthesia with the patient or authorized representative who has indicated his/her understanding and acceptance.   Dental Advisory Given  Plan Discussed with: CRNA and Surgeon  Anesthesia Plan Comments:         Anesthesia Quick Evaluation

## 2012-02-09 NOTE — Preoperative (Signed)
Beta Blockers   Reason not to administer Beta Blockers:Not Applicable 

## 2012-02-10 LAB — WOUND CULTURE: Special Requests: NORMAL

## 2012-02-10 MED ORDER — HYDROMORPHONE HCL PF 1 MG/ML IJ SOLN: 1.0000 mg | INTRAMUSCULAR | Status: DC | PRN

## 2012-02-10 MED ORDER — ALLOPURINOL 100 MG PO TABS
100.0000 mg | ORAL_TABLET | Freq: Every day | ORAL | Status: DC
  Administered 2012-02-10 – 2012-02-11 (×2): 100 mg via ORAL
  Filled 2012-02-10 (×2): qty 1

## 2012-02-10 MED ORDER — LISINOPRIL 20 MG PO TABS: 20.0000 mg | ORAL_TABLET | Freq: Every day | ORAL | Status: DC

## 2012-02-10 MED ORDER — ALPRAZOLAM 0.25 MG PO TABS: 0.2500 mg | ORAL_TABLET | Freq: Every evening | ORAL | Status: DC | PRN

## 2012-02-10 MED ORDER — METOPROLOL SUCCINATE ER 50 MG PO TB24
50.0000 mg | ORAL_TABLET | Freq: Every day | ORAL | Status: DC
  Administered 2012-02-10 – 2012-02-11 (×2): 50 mg via ORAL
  Filled 2012-02-10 (×2): qty 1

## 2012-02-10 MED ORDER — PIPERACILLIN-TAZOBACTAM 3.375 G IVPB 30 MIN
3.3750 g | Freq: Three times a day (TID) | INTRAVENOUS | Status: DC
  Administered 2012-02-10: 3.375 g via INTRAVENOUS

## 2012-02-10 MED ORDER — ADULT MULTIVITAMIN W/MINERALS CH
1.0000 | ORAL_TABLET | Freq: Every day | ORAL | Status: DC
  Administered 2012-02-11: 1 via ORAL
  Filled 2012-02-10 (×2): qty 1

## 2012-02-10 MED ORDER — LISINOPRIL 20 MG PO TABS
20.0000 mg | ORAL_TABLET | Freq: Every day | ORAL | Status: DC
  Administered 2012-02-10 – 2012-02-11 (×2): 20 mg via ORAL
  Filled 2012-02-10 (×2): qty 1

## 2012-02-10 MED ORDER — HEPARIN SODIUM (PORCINE) 5000 UNIT/ML IJ SOLN
5000.0000 [IU] | Freq: Three times a day (TID) | INTRAMUSCULAR | Status: DC
  Administered 2012-02-10: 5000 [IU] via SUBCUTANEOUS

## 2012-02-10 MED ORDER — DIPHENHYDRAMINE HCL 25 MG PO CAPS
25.0000 mg | ORAL_CAPSULE | Freq: Four times a day (QID) | ORAL | Status: DC | PRN
  Administered 2012-02-11: 25 mg via ORAL
  Filled 2012-02-10: qty 1

## 2012-02-10 MED ORDER — DIPHENHYDRAMINE HCL 50 MG/ML IJ SOLN: 25.0000 mg | Freq: Four times a day (QID) | INTRAMUSCULAR | Status: DC | PRN

## 2012-02-10 MED ORDER — ALLOPURINOL 100 MG PO TABS: 100.0000 mg | ORAL_TABLET | Freq: Every day | ORAL | Status: DC

## 2012-02-10 MED ORDER — METOPROLOL SUCCINATE ER 50 MG PO TB24: 50.0000 mg | ORAL_TABLET | Freq: Every day | ORAL | Status: DC

## 2012-02-10 MED ORDER — HYDROMORPHONE HCL PF 1 MG/ML IJ SOLN
0.5000 mg | INTRAMUSCULAR | Status: DC | PRN
  Administered 2012-02-10 – 2012-02-11 (×4): 1 mg via INTRAVENOUS
  Filled 2012-02-10 (×4): qty 1

## 2012-02-10 MED ORDER — HEPARIN SODIUM (PORCINE) 5000 UNIT/ML IJ SOLN
5000.0000 [IU] | Freq: Three times a day (TID) | INTRAMUSCULAR | Status: DC
  Administered 2012-02-10 – 2012-02-11 (×4): 5000 [IU] via SUBCUTANEOUS
  Filled 2012-02-10 (×7): qty 1

## 2012-02-10 NOTE — Op Note (Signed)
[name], [name]                ACCOUNT NO.:  [id number]  MEDICAL RECORD NO.:  [id number]  LOCATION:  1523                         FACILITY:  [facility]  PHYSICIAN:  Lodema Pilot, MD       DATE OF BIRTH:  October 25, 1955  DATE OF PROCEDURE:  02/09/2012 DATE OF DISCHARGE:                              OPERATIVE REPORT   PROCEDURE:  Incision and drainage of abdominal wall abscess.  PREOPERATIVE DIAGNOSIS:  Abdominal wall abscess with fistula.  POSTOPERATIVE DIAGNOSIS:  Abdominal wall abscess.  FLUIDS:  1100 mL of crystalloid.  ESTIMATED BLOOD LOSS:  Minimal.  DRAINS:  Abdominal wall wound packing.  SPECIMENS:  Abdominal wall subcutaneous tissue sent to pathology for permanent section.  COMPLICATIONS:  None apparent.  FINDINGS:  Superficial abscess, the mesh was not visualized, did remove Ethibond suture from the base of the wound.  Otherwise this appeared to be a superficial infection.  INDICATIONS FOR PROCEDURE:  Mr. [name] is a 56 year old male with a complicated history of diverticulitis and a prior hernia repair, ankle ostomy site.  He had done well, except he has had some episodes of pain and possible recurrent diverticulitis.  He recently developed some left lower quadrant pain and redness, induration and drainage from his prior ostomy site.  He had a CT scan, which was concerning for an abscess and even possible fistula to the small bowel under this area.  OPERATIVE DETAILS:  Mr. Prabhakar was seen and evaluated in the preoperative area and risks and benefits of the procedure were discussed in lay terms.  Informed consent was obtained.  He was already on therapeutic antibiotics and the surgical site was marked.  He was taken to the operating room, placed on the table in a supine position and general endotracheal anesthesia was obtained.  The abdomen was prepped and draped in a standard surgical fashion to the area of purulence and the punctum, which was already draining.  I  probed this area with a hemostat, this appeared to track caudad.  I made an elliptical incision to a small punctum and area of drainage and carried this down into the purulent material in the abscess cavity.  The abscess cavity was entered, this did not appear to be very large.  It seemed to track a little bit caudad in the soft tissues.  I irrigated out the wound and inspected the wound.  The tissue appeared to be very hard at the base of the wound and at the deep aspect of the abscess cavity.  I did send the excised tissue for pathology given this area of firm tissue and this was likely indurated tissue from the infection.  I sent this for pathology to rule out malignancy.  He had minimal pus in the area.  I irrigated the wound and cleared this remaining purulent material.  I probed the abscess cavity for any loculations and it did not appear to be a loculated collection.  I did not visualize any mesh at the base of the wound, although he did have a green braided suture consistent with an Ethibond suture and this was removed.  He had some granulation type tissue at the caudad aspect of the  cavity, it did not have any obvious bowel contents or any obvious fistula.  I again irrigated the wound and inspected the wound for hemostasis, which was noted be adequate.  I then packed the wound with a 4 x 4 soaked in 1% lidocaine with epinephrine and 0.25% Marcaine in a 50-50 mixture, and the wound was dressed with a sterile dressing.  All sponge, needle, instrument counts were correct at the end the case except for the 4 x 4 gauze, which was left for wound packing.  The patient tolerated the procedure well and was stable and ready for transfer to the recovery room in stable condition.          ______________________________ Lodema Pilot, MD     BL/MEDQ  D:  02/09/2012  T:  02/10/2012  Job:  [id number]

## 2012-02-10 NOTE — Progress Notes (Signed)
Report to dianne rn, lt abd dsg remains cdi

## 2012-02-10 NOTE — Progress Notes (Signed)
1 Day Post-Op  Subjective: I think he feels better, happy the incision did not have to go deeper.  Objective: Vital signs in last 24 hours: Temp:  [96.9 F (36.1 C)-98.5 F (36.9 C)] 97.8 F (36.6 C) (06/13 0602) Pulse Rate:  [51-73] 59  (06/13 0602) Resp:  [9-18] 16  (06/13 0602) BP: (84-153)/(57-98) 111/72 mmHg (06/13 0602) SpO2:  [93 %-100 %] 94 % (06/13 0602) Last BM Date: 02/07/12  Afebrile, VSS, no labs  Intake/Output from previous day: 06/12 0701 - 06/13 0700 In: 3910 [P.O.:360; I.V.:3200; IV Piggyback:350] Out: 300 [Urine:300] Intake/Output this shift: Total I/O In: 120 [P.O.:120] Out: -   General appearance: alert, cooperative and no distress Resp: clear to auscultation bilaterally GI: soft, non-tender; bowel sounds normal; no masses,  no organomegaly and open area is clean, and redressed with wet to dry.  about 2 4 x 4's.  Erythema around the site is better. No drainage on dressing in the base of the wound.  Lab Results:   Basename 02/08/12 0411 02/07/12 1705  WBC 5.5 6.3  HGB 10.2* 10.8*  HCT 29.7* 30.4*  PLT 210 249    BMET  Basename 02/07/12 1705  NA 138  K 4.6  CL 100  CO2 29  GLUCOSE 94  BUN 16  CREATININE 0.82  CALCIUM 9.3   PT/INR No results found for this basename: LABPROT:2,INR:2 in the last 72 hours  No results found for this basename: AST:5,ALT:5,ALKPHOS:5,BILITOT:5,PROT:5,ALBUMIN:5 in the last 168 hours   Lipase  No results found for this basename: lipase     Studies/Results: Ct Abdomen Pelvis W Contrast  02/08/2012  *RADIOLOGY REPORT*  Clinical Data: Prior ostomy closure, question fistula from small bowel to match, history diverticulitis  CT ABDOMEN AND PELVIS WITH CONTRAST  Technique:  Multidetector CT imaging of the abdomen and pelvis was performed following the standard protocol during bolus administration of intravenous contrast. Sagittal and coronal MPR images reconstructed from axial data set.  Contrast: OMNIPAQUE  IOHEXOL 300 MG/ML  SOLN Dilute oral contrast.  Comparison: 01/27/2012  Findings: Bibasilar atelectasis and small left pleural effusion. Liver, spleen, pancreas, kidneys, and right adrenal gland normal appearance. Minimally thickened and question slightly nodular left adrenal gland, stable. Unremarkable bladder and ureters. Dilated left inguinal canal containing fat consistent with inguinal hernia.  Area of infiltrative change again identified in the left mid/lower abdomen at the anterior abdominal wall, similar to previous exam, question postoperative scarring versus inflammatory process/infection, changes minimally increased versus prior exam. Thin linear foci of high attenuation and gas seen at this site could potentially represent a tiny fistula though this does not extend to the skin surface. No discrete abscess collection identified. A single loop of small bowel demonstrates wall thickening and is associated with this site of infiltration.  Associated overlying skin thickening increased since previous exam. Diffuse colonic diverticulosis. Stomach and remaining small bowel loops otherwise unremarkable. No discrete abscess collection, free intraperitoneal air or free fluid. No acute osseous findings.  IMPRESSION: Tiny linear collection of high attenuation and gas is seen at the site of infiltration at the left mid/lower quadrant enter abdominal wall, could potentially represent a tiny fistula though this does not extend to the skin surface. The area of infiltration within the subcutaneous fat at the anterior abdominal wall in the left abdomen appears minimally increased since previous exam and is associated with increased overlying skin thickening. Associated thickening of a single small bowel loop deep to this site. No evidence of abscess or perforation.  Scattered colonic diverticulosis. Left inguinal hernia.  Original Report Authenticated By: Lollie Marrow, M.D.    Medications:    . allopurinol  100 mg  Oral Daily  . heparin subcutaneous  5,000 Units Subcutaneous Q8H  . lisinopril  20 mg Oral Daily  . metoprolol succinate  50 mg Oral Daily  . ondansetron (ZOFRAN) IV  4 mg Intravenous Once  . piperacillin-tazobactam (ZOSYN)  IV  3.375 g Intravenous Q8H  . DISCONTD: allopurinol  100 mg Oral Daily  . DISCONTD: allopurinol  100 mg Oral Daily  . DISCONTD: heparin  5,000 Units Subcutaneous Q8H  . DISCONTD: heparin subcutaneous  5,000 Units Subcutaneous Q8H  . DISCONTD: lisinopril  20 mg Oral Daily  . DISCONTD: lisinopril  20 mg Oral Daily  . DISCONTD: metoprolol succinate  50 mg Oral Daily  . DISCONTD: metoprolol succinate  50 mg Oral Daily  . 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 Layton. Hx of diverticulitis/Hartmans/closure of ostomy/ostomy Hernia with mesh repair. [facility].  Hypertension  Gout  Plan:  Continue the antibiotics, wet to dry dressings, bid, he's on a regular diet.  Find out if we need to repeat CT before he goes home. He is on Zosyn       LOS: 3 days    [name] 02/10/2012

## 2012-02-10 NOTE — Progress Notes (Signed)
General Surgery [facility] - Attending  Will arrange [facility] to assist with home dressing changes.  Velora Heckler, MD, [facility] Office: [phone]

## 2012-02-11 LAB — COMPREHENSIVE METABOLIC PANEL
ALT: 15 U/L (ref 0–53)
AST: 22 U/L (ref 0–37)
Albumin: 3.1 g/dL — ABNORMAL LOW (ref 3.5–5.2)
Chloride: 103 mEq/L (ref 96–112)
Creatinine, Ser: 0.87 mg/dL (ref 0.50–1.35)
Potassium: 3.8 mEq/L (ref 3.5–5.1)
Sodium: 139 mEq/L (ref 135–145)
Total Bilirubin: 0.4 mg/dL (ref 0.3–1.2)

## 2012-02-11 LAB — CBC
Platelets: 272 10*3/uL (ref 150–400)
RBC: 3.16 MIL/uL — ABNORMAL LOW (ref 4.22–5.81)
RDW: 12.1 % (ref 11.5–15.5)
WBC: 5.6 10*3/uL (ref 4.0–10.5)

## 2012-02-11 MED ORDER — ACETAMINOPHEN 325 MG PO TABS: 650.0000 mg | ORAL_TABLET | Freq: Four times a day (QID) | ORAL | Status: AC | PRN

## 2012-02-11 MED ORDER — AMOXICILLIN-POT CLAVULANATE 875-125 MG PO TABS
1.0000 | ORAL_TABLET | Freq: Two times a day (BID) | ORAL | Status: DC
  Administered 2012-02-11: 1 via ORAL
  Filled 2012-02-11: qty 1

## 2012-02-11 MED ORDER — AMOXICILLIN-POT CLAVULANATE 875-125 MG PO TABS: 1.0000 | ORAL_TABLET | Freq: Two times a day (BID) | ORAL | Status: AC

## 2012-02-11 MED ORDER — OXYCODONE-ACETAMINOPHEN 5-325 MG PO TABS: 1.0000 | ORAL_TABLET | ORAL | Status: AC | PRN

## 2012-02-11 NOTE — Progress Notes (Signed)
2 Days Post-Op  Subjective: Eating breakfast, feels better.  Objective: Vital signs in last 24 hours: Temp:  [97.6 F (36.4 C)-99.4 F (37.4 C)] 98.3 F (36.8 C) (06/14 1610) Pulse Rate:  [59-67] 62  (06/14 0608) Resp:  [18-20] 18  (06/14 9604) BP: (123-162)/(77-94) 133/80 mmHg (06/14 0608) SpO2:  [93 %-100 %] 95 % (06/14 0608) Last BM Date: 02/07/12  Afebrile, VSS, labs OK, No GROWTH ON CULTURE  Intake/Output from previous day: 06/13 0701 - 06/14 0700 In: 1230 [P.O.:1080; IV Piggyback:150] Out: 1250 [Urine:1250] Intake/Output this shift:    General appearance: alert, cooperative and no distress Incision/Wound: Open area is clean, no drainage in the wound.  The lower abdominal wall border cellulits  of the incision  Is still erythematous and indurated.  I cannot open any passage into the area that is like this.  I have repacked it.  Lab Results:   [name] 02/11/12 0422  WBC 5.6  HGB 10.3*  HCT 30.0*  PLT 272    BMET  Basename 02/11/12 0422  NA 139  K 3.8  CL 103  CO2 27  GLUCOSE 133*  BUN 14  CREATININE 0.87  CALCIUM 9.5   PT/INR No results found for this basename: LABPROT:2,INR:2 in the last 72 hours   Lab 02/11/12 0422  AST 22  ALT 15  ALKPHOS 61  BILITOT 0.4  PROT 6.6  ALBUMIN 3.1*     Lipase  No results found for this basename: lipase     Studies/Results: No results found.  Medications:    . allopurinol  100 mg Oral Daily  . heparin subcutaneous  5,000 Units Subcutaneous Q8H  . lisinopril  20 mg Oral Daily  . metoprolol succinate  50 mg Oral Daily  . multivitamin with minerals  1 tablet Oral Daily  . ondansetron (ZOFRAN) IV  4 mg Intravenous Once  . 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 Layton.  Hx of  diverticulitis/Hartmans/closure of ostomy/ostomy Hernia with mesh repair. [facility]  Hypertension  Gout  Plan:  We would like him to go home.  I want to allow Dr. Gerrit Friends to see and keep him on  IV antibiotics till then. I will get him ready to go later if OK on Augmentin for 10 days and follow up with Dr. Biagio Quint in 2 weeks      LOS: 4 days    [name] 02/11/2012

## 2012-02-11 NOTE — Care Management Note (Signed)
    Page 1 of 2   02/11/2012     12:02:41 PM   CARE MANAGEMENT NOTE 02/11/2012  Patient:  [name], [name]   Account Number:  [id number]  Date Initiated:  02/08/2012  Documentation initiated by:  Lorenda Ishihara  Subjective/Objective Assessment:   56 yo male admitted with cellulitis of old ostomy site, possible abscess vs fistula. PTA lived at home with friend. PCP is Dr. Aida Puffer.     Action/Plan:   Anticipated DC Date:  02/11/2012   Anticipated DC Plan:  HOME W HOME HEALTH SERVICES      DC Planning Services  CM consult      [facility] Choice  HOME HEALTH   Choice offered to / List presented to:  C-1 Patient        HH arranged  HH-1 RN      [facility] agency  [facility]   Status of service:  Completed, signed off Medicare Important Message given?   (If response is "NO", the following Medicare IM given date fields will be blank) Date Medicare IM given:   Date Additional Medicare IM given:    Discharge Disposition:  HOME W HOME HEALTH SERVICES  Per UR Regulation:  Reviewed for med. necessity/level of care/duration of stay  If discussed at Long Length of Stay Meetings, dates discussed:    Comments:  02-11-12 Lorenda Ishihara RN CM [id number] Recieved a referral to arrange [facility] RN for wound care/dressing changes. Spoke with patient, he wants to discuss with his PCP prior to arranging. He is awaiting callback from PCP now. Provided patient with list of HH agencies for choice, asked about [facility] so contacted them to speak with patient about services and cost. Instructed patient that he could arrange post d/c with PCP or surgeon, they would have to contact the agency and provide orders. Patient voiced understanding of process and will await call from PCP then speak with surgeon about options. Will continue to be available as needed to assist with d/c planning. Anticipate d/c home later today.

## 2012-02-11 NOTE — Progress Notes (Signed)
Received a call from [facility], patient has declined services. States he will discuss with attending but feels comfortable doing own dressing changes and wound care.

## 2012-02-11 NOTE — Discharge Summary (Signed)
General Surgery [facility] - Attending  Agree.  See today's progress note.  Velora Heckler, MD, [facility] Office: [phone]

## 2012-02-11 NOTE — Progress Notes (Signed)
Pt DC to home. Dc instructions and medication reviewed with Pt. PT states understanding. Pt All questions answered.

## 2012-02-11 NOTE — Progress Notes (Signed)
General Surgery [facility] - Attending  Patient seen and examined.  Afebrile.  WBC normal.  Wound clean without drainage - dressing changed at bedside.  Mild erythema and induration persist around wound.  OK for discharge home on oral antibiotics.  Will see in office in 5 days for wound check (Urgent Office clinic).  Velora Heckler, MD, [facility] Office: [phone]

## 2012-02-11 NOTE — Discharge Summary (Signed)
Physician Discharge Summary  Patient ID: [name] MRN: [id number] DOB/AGE: [birth year] 56 y.o.  Admit date: 02/07/2012 Discharge date: 02/11/2012  Admission Diagnoses: Abdominal wall abscess with fistula. Hx of diverticulitis/Hartmans/closure of ostomy/ostomy Hernia with mesh repair. [facility].  Hypertension  Gout   Discharge Diagnoses: Abdominal wall abscess.  Hx of diverticulitis/Hartmans/closure of ostomy/ostomy Hernia with mesh repair. [facility].  Hypertension  Gout  Active Problems:  H/O diverticulitis of colon-prior colectomcy, Hartman procedure, closure and repair of ostomy hernia with mesh in [facility]  Gout  Hypertension   PROCEDURES: Incision and drainage of abdominal wall abscess. 02/10/12 Dr. Biagio Quint.   Hospital Course: [name] is an 56 y.o. male who is followed by Dr. Rosanne Ashing little in [place] and who has a history of diverticulitis in years past treated with a Hartmann procedure followed by closure of his ostomy and then following with closure of his ostomy hernia with mesh. This last procedure was done down at [facility]. This was several years ago.  And may the patient started noticing a mass at the site of his ostomy that was firm and which he was seen by Dr. Clarene Duke and a CT scan was performed. This was reviewed by me and I would tend to disagree with the findings. He does have small bowel with contrast intimately associated with this region and in light of the subsequent behavior with what now is cellulitis and abscess I am concerned he may be developing a fistula. I probed this wound in the ED and it went in about an inch and was opened and contained frank pus.  The possibility cultured and Dr. Fredirick Maudlin office earlier. Dr. Daphine Deutscher discussed options with [name] and recommended IV antibiotics repeat CT scan to see if we can demonstrate any early fistula formation to the mesh. CT supported concern with a Tiny linear collection  of high attenuation and gas is seen at thesite of infiltration at the left mid/lower quadrant enter abdominalwall, could potentially represent a tiny fistula though this doesnot extend to the skin surface. I/D showed a superficial abscess, with ethibond suture at the base.  The culture has been negative.  He is improving but still has an indurated cellulitis at the base of the open area.  We anticipate d/c home with with to dry dressing and 10 days of Augmentin.  Dr. Gerrit Friends thinks he can go home and will be seen in the Urgent office next Wed. 02/16/12 at 2 PM. Follow up here or at home 2 weeks.  CONDITION ON DC:  IMPROVING          Disposition: Final discharge disposition not confirmed  Discharge Orders    Future Appointments: Provider: Department: Dept Phone: Center:   02/16/2012 2:30 PM Caleen Essex III, MD [facility] [phone] None     Medication List  As of 02/11/2012  2:35 PM   STOP taking these medications         oxyCODONE-acetaminophen 10-325 MG per tablet         TAKE these medications         acetaminophen 325 MG tablet   Commonly known as: TYLENOL   Take 2 tablets (650 mg total) by mouth every 6 (six) hours as needed (or Temp > 100).      allopurinol 300 MG tablet   Commonly known as: ZYLOPRIM   Take 100 mg by mouth daily.      ALPRAZolam 0.25 MG tablet   Commonly known as: Prudy Feeler  Take 0.25 mg by mouth at bedtime as needed. Anxiety      amoxicillin-clavulanate 875-125 MG per tablet   Commonly known as: AUGMENTIN   Take 1 tablet by mouth every 12 (twelve) hours.      lisinopril 20 MG tablet   Commonly known as: PRINIVIL,ZESTRIL   Take 20 mg by mouth daily.      metoprolol succinate 50 MG 24 hr tablet   Commonly known as: TOPROL-XL   Take 50 mg by mouth daily. Take with or immediately following a meal.      multivitamin with minerals Tabs   Take 1 tablet by mouth daily.      oxyCODONE-acetaminophen 5-325 MG per tablet   Commonly known as:  PERCOCET   Take 1 tablet by mouth every 4 (four) hours as needed.           Follow-up Information    Follow up with Lodema Pilot DAVID, DO on 02/16/2012. (Be at office at 2:00 PM for a dressing change by DR. Carolynne Edouard)    Contact information:   [address] [place] [address] [phone]       Follow up with Lodema Pilot DAVID, DO. Schedule an appointment as soon as possible for a visit in 2 weeks.   Contact information:   [address] [place] [address] [phone]       Please follow up. (Wet to dry dressing twice a day.  You can shower and let water clean area then redress.)          Signed: Kaylyn Garrow 02/11/2012, 2:35 PM

## 2012-02-11 NOTE — Discharge Instructions (Signed)
Wound Infection A wound infection happens when a type of germ (bacteria) grows in a wound. Caring for the infection can help the wound heal. Wound infections need treatment. HOME CARE   Only take medicine as told by your doctor.   Take your antibiotic medicine as told. Finish it even if you start to feel better.   Clean the wound with mild soap and water as told. Rinse the soap off. Pat the area dry with a clean towel. Do not rub the wound.   Change any bandages (dressings) as told by your doctor.   Put cream and a bandage on the wound as told by your doctor.   If the bandage sticks, wet it with soapy water to remove the bandage.   Change the bandage if it gets wet, dirty, or starts to smell.   Take showers. Do not take baths, swim, or do anything that puts your wound under water.   Avoid exercise that makes you sweat.   If your wound itches, use a medicine that helps stop itching. Do not pick or scratch at the wound.   Keep all doctor visits as told.  GET HELP RIGHT AWAY IF:   You have more puffiness (swelling), pain, or redness around the wound.   You have more yellowish-white fluid (pus) coming from the wound.   You have a bad smell coming from the wound.   Your wound breaks open more.   You have a fever.  MAKE SURE YOU:   Understand these instructions.   Will watch your condition.   Will get help right away if you are not doing well or get worse.  Document Released: 05/25/2008 Document Revised: 08/05/2011 Document Reviewed: 01/25/2011 [facility] Patient Information 2012 [place], [place].CCS      [facility], [place] [phone]  OPEN ABDOMINAL SURGERY: POST OP INSTRUCTIONS  Always review your discharge instruction sheet given to you by the facility where your surgery was performed.  IF YOU HAVE DISABILITY OR FAMILY LEAVE FORMS, YOU MUST BRING THEM TO THE OFFICE FOR PROCESSING.  PLEASE DO NOT GIVE THEM TO YOUR DOCTOR.  1. A prescription for pain  medication may be given to you upon discharge.  Take your pain medication as prescribed, if needed.  If narcotic pain medicine is not needed, then you may take acetaminophen (Tylenol) or ibuprofen (Advil) as needed. 2. Take your usually prescribed medications unless otherwise directed. 3. If you need a refill on your pain medication, please contact your pharmacy. They will contact our office to request authorization.  Prescriptions will not be filled after 5pm or on week-ends. 4. You should follow a light diet the first few days after arrival home, such as soup and crackers, pudding, etc.unless your doctor has advised otherwise. A high-fiber, low fat diet can be resumed as tolerated.   Be sure to include lots of fluids daily. Most patients will experience some swelling and bruising on the chest and neck area.  Ice packs will help.  Swelling and bruising can take several days to resolve 5. Most patients will experience some swelling and bruising in the area of the incision. Ice pack will help. Swelling and bruising can take several days to resolve..  6. It is common to experience some constipation if taking pain medication after surgery.  Increasing fluid intake and taking a stool softener will usually help or prevent this problem from occurring.  A mild laxative (Milk of Magnesia or Miralax) should be taken according to package directions if there  are no bowel movements after 48 hours. 7.  You may have steri-strips (small skin tapes) in place directly over the incision.  These strips should be left on the skin for 7-10 days.  If your surgeon used skin glue on the incision, you may shower in 24 hours.  The glue will flake off over the next 2-3 weeks.  Any sutures or staples will be removed at the office during your follow-up visit. You may find that a light gauze bandage over your incision may keep your staples from being rubbed or pulled. You may shower and replace the bandage daily. 8. ACTIVITIES:  You may  resume regular (light) daily activities beginning the next day--such as daily self-care, walking, climbing stairs--gradually increasing activities as tolerated.  You may have sexual intercourse when it is comfortable.  Refrain from any heavy lifting or straining until approved by your doctor. a. You may drive when you no longer are taking prescription pain medication, you can comfortably wear a seatbelt, and you can safely maneuver your car and apply brakes b. Return to Work: ___________________________________ 9. You should see your doctor in the office for a follow-up appointment approximately two weeks after your surgery.  Make sure that you call for this appointment within a day or two after you arrive home to insure a convenient appointment time. OTHER INSTRUCTIONS:  _____________________________________________________________ _____________________________________________________________  WHEN TO CALL YOUR DOCTOR: 1. Fever over 101.0 2. Inability to urinate 3. Nausea and/or vomiting 4. Extreme swelling or bruising 5. Continued bleeding from incision. 6. Increased pain, redness, or drainage from the incision. 7. Difficulty swallowing or breathing 8. Muscle cramping or spasms. 9. Numbness or tingling in hands or feet or around lips.  The clinic staff is available to answer your questions during regular business hours.  Please don't hesitate to call and ask to speak to one of the nurses if you have concerns.  For further questions, please visit [email or web address]  Wound Infection A wound infection happens when a type of germ (bacteria) grows in a wound. Caring for the infection can help the wound heal. Wound infections need treatment. HOME CARE   Only take medicine as told by your doctor.   Take your antibiotic medicine as told. Finish it even if you start to feel better.   Clean the wound with mild soap and water as told. Rinse the soap off. Pat the area dry with a clean  towel. Do not rub the wound.   Change any bandages (dressings) as told by your doctor.   Put cream and a bandage on the wound as told by your doctor.   If the bandage sticks, wet it with soapy water to remove the bandage.   Change the bandage if it gets wet, dirty, or starts to smell.   Take showers. Do not take baths, swim, or do anything that puts your wound under water.   Avoid exercise that makes you sweat.   If your wound itches, use a medicine that helps stop itching. Do not pick or scratch at the wound.   Keep all doctor visits as told.  GET HELP RIGHT AWAY IF:   You have more puffiness (swelling), pain, or redness around the wound.   You have more yellowish-white fluid (pus) coming from the wound.   You have a bad smell coming from the wound.   Your wound breaks open more.   You have a fever.  MAKE SURE YOU:   Understand these instructions.   Will watch your  condition.   Will get help right away if you are not doing well or get worse.  Document Released: 05/25/2008 Document Revised: 08/05/2011 Document Reviewed: 01/25/2011 [facility] Patient Information 2012 [place], [place].

## 2012-02-16 ENCOUNTER — Encounter (INDEPENDENT_AMBULATORY_CARE_PROVIDER_SITE_OTHER): Payer: Self-pay | Admitting: General Surgery

## 2012-02-16 ENCOUNTER — Ambulatory Visit (INDEPENDENT_AMBULATORY_CARE_PROVIDER_SITE_OTHER): Payer: PRIVATE HEALTH INSURANCE | Admitting: General Surgery

## 2012-02-16 VITALS — BP 120/82 | HR 60 | Temp 97.3°F | Resp 16 | Ht 73.0 in | Wt 224.0 lb

## 2012-02-16 DIAGNOSIS — T148XXA Other injury of unspecified body region, initial encounter: Secondary | ICD-10-CM | POA: Insufficient documentation

## 2012-02-16 NOTE — Patient Instructions (Signed)
Continue to shower and change dressing daily 

## 2012-02-16 NOTE — Progress Notes (Signed)
Subjective:     Patient ID: [name], male   DOB: 1955/10/24, 56 y.o.   MRN: [id number]  HPI The patient is a 56 year old white male who was recently incised and drained by Dr. Biagio Quint for a wound on his left mid abdominal wall. He comes in today for a wound check. He apparently has a history of multiple abdominal surgeries at [facility] in 2005 and 2006 for perforated diverticulitis and then subsequently a ventral hernia repair with mesh. It sounds as though there was no exposed mesh at the time of the incision and drainage. He continues to have some pain at the operative site but denies any fevers or drainage.  Review of Systems     Objective:   Physical Exam On exam the open wound on his left abdominal wall is very clean with good granulation tissue . The wound was repacked today and he tolerated this well.    Assessment:     Status post incision and drainage of left abdominal wall abscess    Plan:     At this point we will continue daily dressing changes and getting in the shower and washing the wound. We will have him followup with Dr. Biagio Quint next week or 2 for a wound check

## 2012-02-28 NOTE — Anesthesia Postprocedure Evaluation (Signed)
  Anesthesia Post-op Note  Patient: [name]  Procedure(s) Performed: Procedure(s) (LRB): INCISION AND DRAINAGE ABSCESS (Left)  Patient Location: PACU  Anesthesia Type: General  Level of Consciousness: awake and alert   Airway and Oxygen Therapy: Patient Spontanous Breathing  Post-op Pain: mild  Post-op Assessment: Post-op Vital signs reviewed, Patient's Cardiovascular Status Stable, Respiratory Function Stable, Patent Airway and No signs of Nausea or vomiting  Post-op Vital Signs: stable  Complications: No apparent anesthesia complications

## 2012-03-10 ENCOUNTER — Encounter (INDEPENDENT_AMBULATORY_CARE_PROVIDER_SITE_OTHER): Payer: Self-pay | Admitting: General Surgery

## 2012-03-10 ENCOUNTER — Ambulatory Visit (INDEPENDENT_AMBULATORY_CARE_PROVIDER_SITE_OTHER): Payer: PRIVATE HEALTH INSURANCE | Admitting: General Surgery

## 2012-03-10 VITALS — BP 155/100 | HR 89 | Temp 99.1°F | Ht 73.0 in | Wt 223.6 lb

## 2012-03-10 DIAGNOSIS — Z5189 Encounter for other specified aftercare: Secondary | ICD-10-CM

## 2012-03-10 DIAGNOSIS — Z4889 Encounter for other specified surgical aftercare: Secondary | ICD-10-CM

## 2012-03-10 NOTE — Progress Notes (Signed)
Subjective:     Patient ID: [name], male   DOB: 27-Nov-1955, 56 y.o.   MRN: [id number]  HPI This patient follows up status post incision and drainage of the left abdominal wall abscess performed 02/09/2012. There was concern at that time for possible mesh infection or fistulas that the mesh was not identified in the base of the wound. Cultures were negative and biopsy of the abdominal wall tissue was negative as well. He has been doing wet to dry dressing changes and has completed his course of antibiotics and the wound has nearly healed. He denies any fevers or chills but does say that he continues to have some discomfort in the area with activity.  Review of Systems     Objective:   Physical Exam No distress and nontoxic-appearing There is no evidence of residual infection. There is no cellulitis or induration or fluctuance. The cavity has nearly completely healed and is flush with the skin but he has a less than 1 cm tall by 2 cm wide area of granulation tissue and sore nitrate was applied. I suspect that this will be healed shortly. There is no evidence of any fistula.    Assessment:     Abdominal wall abscess I do not see any evidence of fistula or residual infection or chronic mesh infection. He does have some tenderness in the area still which could be indicative of chronic infection although visibly this is nearly healed. I applied some subnitrate to the granulation tissue and I think that this will be healed in the next 2-3 weeks. I recommended that he followup with me in about 3 weeks and this should be nearly completely resolved at that time.    Plan:     F/u in 3 weeks

## 2012-03-17 ENCOUNTER — Encounter (INDEPENDENT_AMBULATORY_CARE_PROVIDER_SITE_OTHER): Payer: Self-pay | Admitting: General Surgery

## 2012-03-17 ENCOUNTER — Ambulatory Visit (INDEPENDENT_AMBULATORY_CARE_PROVIDER_SITE_OTHER): Payer: PRIVATE HEALTH INSURANCE | Admitting: General Surgery

## 2012-03-17 VITALS — BP 160/102 | HR 72 | Temp 98.8°F | Resp 16 | Ht 73.0 in | Wt 229.8 lb

## 2012-03-17 DIAGNOSIS — K632 Fistula of intestine: Secondary | ICD-10-CM

## 2012-03-17 MED ORDER — NYSTATIN 100000 UNIT/GM EX POWD: Freq: Four times a day (QID) | CUTANEOUS | Status: AC

## 2012-03-17 NOTE — Progress Notes (Signed)
Subjective:     Patient ID: [name], male   DOB: 04-12-1956, 56 y.o.   MRN: [id number]  HPI This patient presents for followup evaluation of his left lower quadrant wound. He had incision and drainage of abdominal abscess and I saw him last week and his wound was almost completely healed and really had no significant drainage from his wound.  about 2 days ago he developed some redness and some discomfort around the wound and it began draining some greenish thin fluid. He saw his primary physician Dr. Clarene Duke and he said that it drained about a quart of fluid in his physician put him on Cipro and Flagyl. Since it has not drained nearly as much blood it is still draining some greenish fluid  Review of Systems     Objective:   Physical Exam No distress and nontoxic-appearing he has some mild local tenderness around the area and what appears to be a little fungal reaction around the opening but there isn't much drainage currently. There is no evidence of undrained fluid collection or fluctuance or abscess.    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 It appears to be a low output fistula if it is. I recommended CT scan with oral contrast to further evaluate. It is low output and he is maintaining his hydration and if it is a fistula I think it's fine to treat this as an outpatient. I did offer hospital admission for IV hydration and antibiotics and to work this up but he was not interested in remission at this point. He will continue his antibiotics and we will set him up for a CT scan to further evaluate    Plan:     We will check a CT scan of the abdomen with oral contrast to evaluate for possible fistula or mesh infection and he will follow up after this.

## 2012-03-23 ENCOUNTER — Other Ambulatory Visit: Payer: Self-pay

## 2012-03-23 ENCOUNTER — Telehealth (INDEPENDENT_AMBULATORY_CARE_PROVIDER_SITE_OTHER): Payer: Self-pay

## 2012-03-23 NOTE — Telephone Encounter (Signed)
The pt did not show for his ct today

## 2012-03-31 ENCOUNTER — Encounter (INDEPENDENT_AMBULATORY_CARE_PROVIDER_SITE_OTHER): Payer: PRIVATE HEALTH INSURANCE | Admitting: General Surgery

## 2012-04-03 ENCOUNTER — Encounter (INDEPENDENT_AMBULATORY_CARE_PROVIDER_SITE_OTHER): Payer: Self-pay | Admitting: General Surgery

## 2013-06-08 ENCOUNTER — Other Ambulatory Visit: Payer: Self-pay | Admitting: *Deleted

## 2013-06-08 DIAGNOSIS — R55 Syncope and collapse: Secondary | ICD-10-CM

## 2013-06-18 ENCOUNTER — Encounter: Payer: Self-pay | Admitting: *Deleted

## 2013-06-18 ENCOUNTER — Encounter (INDEPENDENT_AMBULATORY_CARE_PROVIDER_SITE_OTHER): Payer: BC Managed Care – PPO

## 2013-06-18 DIAGNOSIS — R55 Syncope and collapse: Secondary | ICD-10-CM

## 2013-06-18 NOTE — Progress Notes (Signed)
Patient ID: [name], male   DOB: November 11, 1955, 57 y.o.   MRN: [id number] 48 Hour E-Cardio holter monitor applied to patient.

## 2013-06-20 ENCOUNTER — Encounter: Payer: Self-pay | Admitting: *Deleted

## 2013-06-20 NOTE — Progress Notes (Signed)
Patient ID: [name], male   DOB: [birth year], 57 y.o.   MRN: [id number] Patient stated had problem with 48 hour holter monitor and the screen went blank.  He took the battery out and put it back in again.  Removing the battery stops the recording action.  Patient came into the office 06/20/2013.  A new 48 hour holter monitor test was started.

## 2013-06-29 ENCOUNTER — Telehealth: Payer: Self-pay | Admitting: Cardiology

## 2013-06-29 NOTE — Telephone Encounter (Signed)
Pt was not calling for treadmill test, he was call for his holter monitor results.  He is not a pt of Dr. Myrtis Ser His primary care doctor Aida Puffer from [place], [place] ordered & has not received the results as of yet.  Pt was quite unhappy in spite of trying to resolve the matter for the pt Mylo Red RN

## 2013-06-29 NOTE — Telephone Encounter (Signed)
lmtcb Debbie Jessic Standifer RN  

## 2013-06-29 NOTE — Telephone Encounter (Signed)
New Problem  Pt calling states his he has not received results and he is from previous treadmill test.. Requests a call back

## 2013-07-01 NOTE — Telephone Encounter (Signed)
**Note De-Identified Stout Obfuscation** I read some Holter's at the end of last week and gave them to [name] late in the day. I think she is probably not in the office on Monday, November 3. Please ask Burnett Harry to find these holters and then call me on my cell on Monday, November 3 so that we can be sure we have result the issue

## 2013-07-03 NOTE — Telephone Encounter (Signed)
Holter Monitor results placed in folder at holter room door on Friday 10/31.

## 2013-11-26 ENCOUNTER — Ambulatory Visit
Admission: RE | Admit: 2013-11-26 | Discharge: 2013-11-26 | Disposition: A | Discharge status: Home / Self Care | Payer: 59 | Source: Ambulatory Visit | Attending: Family Medicine | Admitting: Family Medicine

## 2013-11-26 ENCOUNTER — Other Ambulatory Visit: Payer: Self-pay | Admitting: Family Medicine

## 2013-11-26 DIAGNOSIS — R059 Cough, unspecified: Secondary | ICD-10-CM

## 2013-11-26 DIAGNOSIS — R05 Cough: Secondary | ICD-10-CM

## 2013-11-26 DIAGNOSIS — Z801 Family history of malignant neoplasm of trachea, bronchus and lung: Secondary | ICD-10-CM

## 2013-11-27 ENCOUNTER — Other Ambulatory Visit: Payer: Self-pay | Admitting: Family Medicine

## 2013-11-27 DIAGNOSIS — R9389 Abnormal findings on diagnostic imaging of other specified body structures: Secondary | ICD-10-CM

## 2013-11-30 ENCOUNTER — Other Ambulatory Visit: Payer: 59

## 2013-12-21 ENCOUNTER — Encounter (INDEPENDENT_AMBULATORY_CARE_PROVIDER_SITE_OTHER): Payer: Self-pay

## 2013-12-21 ENCOUNTER — Ambulatory Visit
Admission: RE | Admit: 2013-12-21 | Discharge: 2013-12-21 | Disposition: A | Discharge status: Home / Self Care | Payer: 59 | Source: Ambulatory Visit | Attending: Family Medicine | Admitting: Family Medicine

## 2013-12-21 DIAGNOSIS — R9389 Abnormal findings on diagnostic imaging of other specified body structures: Secondary | ICD-10-CM

## 2013-12-21 MED ORDER — IOHEXOL 300 MG/ML  SOLN
75.0000 mL | Freq: Once | INTRAMUSCULAR | Status: AC | PRN
  Administered 2013-12-21: 75 mL via INTRAVENOUS

## 2014-06-13 ENCOUNTER — Encounter (INDEPENDENT_AMBULATORY_CARE_PROVIDER_SITE_OTHER): Payer: Self-pay

## 2014-06-13 ENCOUNTER — Other Ambulatory Visit: Payer: Self-pay | Admitting: Family Medicine

## 2014-06-13 ENCOUNTER — Ambulatory Visit
Admission: RE | Admit: 2014-06-13 | Discharge: 2014-06-13 | Disposition: A | Discharge status: Home / Self Care | Payer: 59 | Source: Ambulatory Visit | Attending: Family Medicine | Admitting: Family Medicine

## 2014-06-13 DIAGNOSIS — J9811 Atelectasis: Secondary | ICD-10-CM

## 2015-03-12 ENCOUNTER — Other Ambulatory Visit: Payer: Self-pay | Admitting: Family Medicine

## 2015-03-12 ENCOUNTER — Ambulatory Visit
Admission: RE | Admit: 2015-03-12 | Discharge: 2015-03-12 | Disposition: A | Discharge status: Home / Self Care | Payer: 59 | Source: Ambulatory Visit | Attending: Family Medicine | Admitting: Family Medicine

## 2015-03-12 DIAGNOSIS — G8929 Other chronic pain: Secondary | ICD-10-CM

## 2015-03-12 DIAGNOSIS — M25519 Pain in unspecified shoulder: Principal | ICD-10-CM

## 2016-06-10 IMAGING — CR DG CERVICAL SPINE COMPLETE 4+V
6 series · 6 of 6 positions shown · non-contrast
Comparison: None.

CLINICAL DATA: Chronic left shoulder and neck pain with limited
range of motion in the neck.

EXAM:
CERVICAL SPINE  4+ VIEWS

[w cervical spine lat]
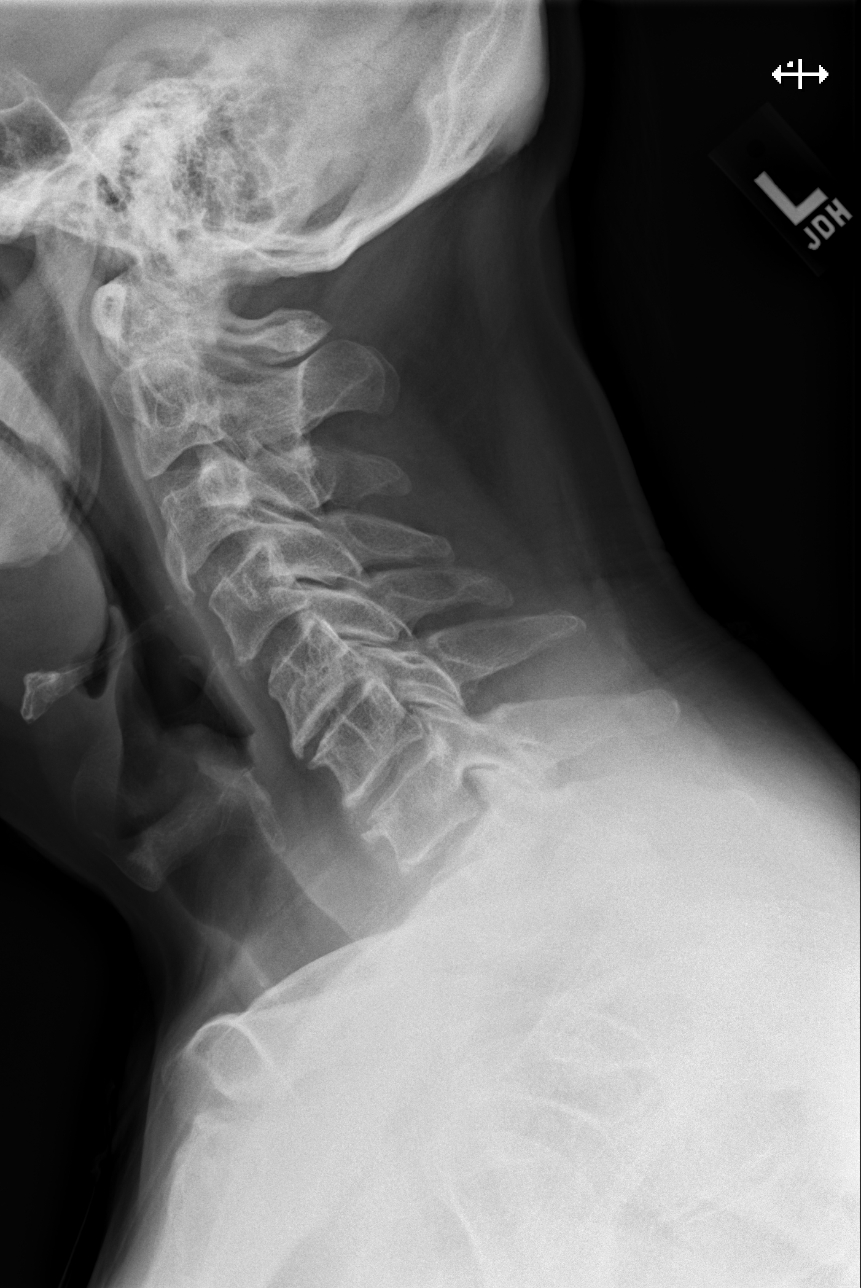

[w cervical spine ap_obl (1 of 2)]
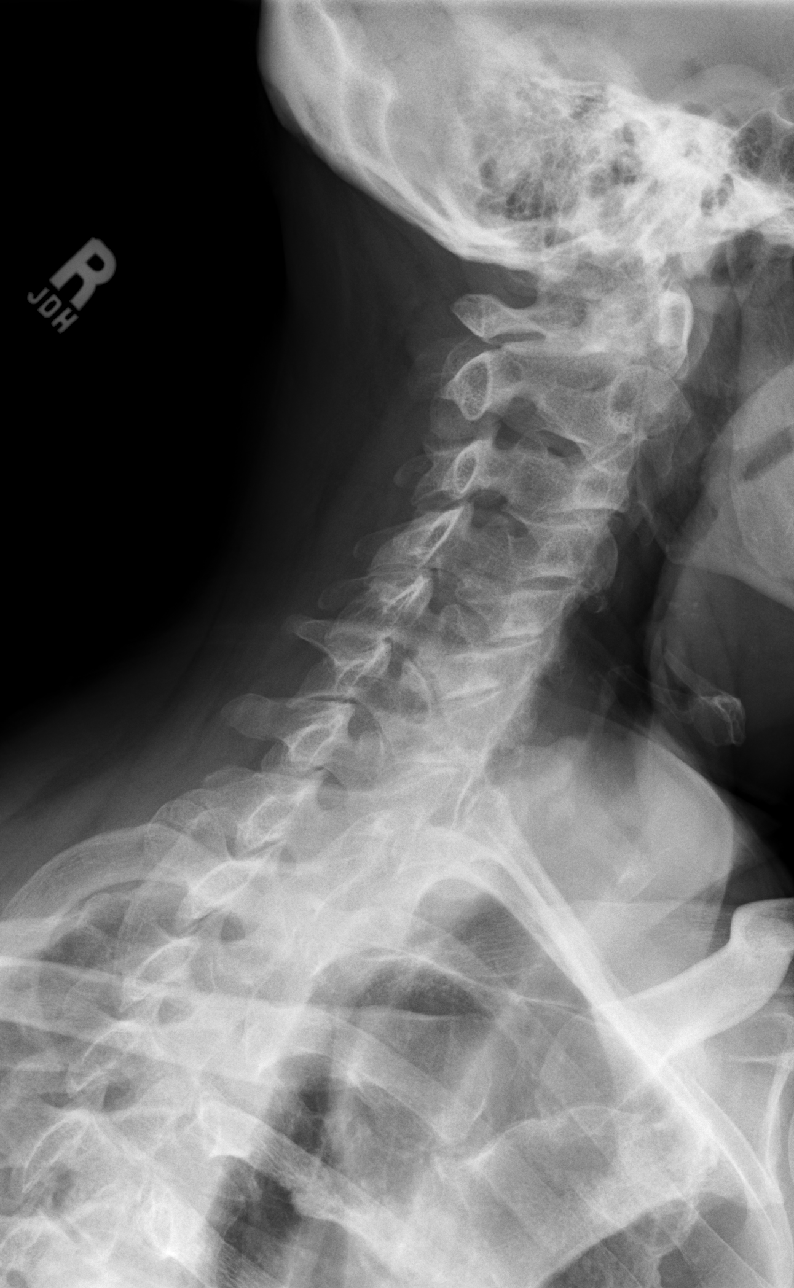

[w cervical spine ap_obl (2 of 2)]
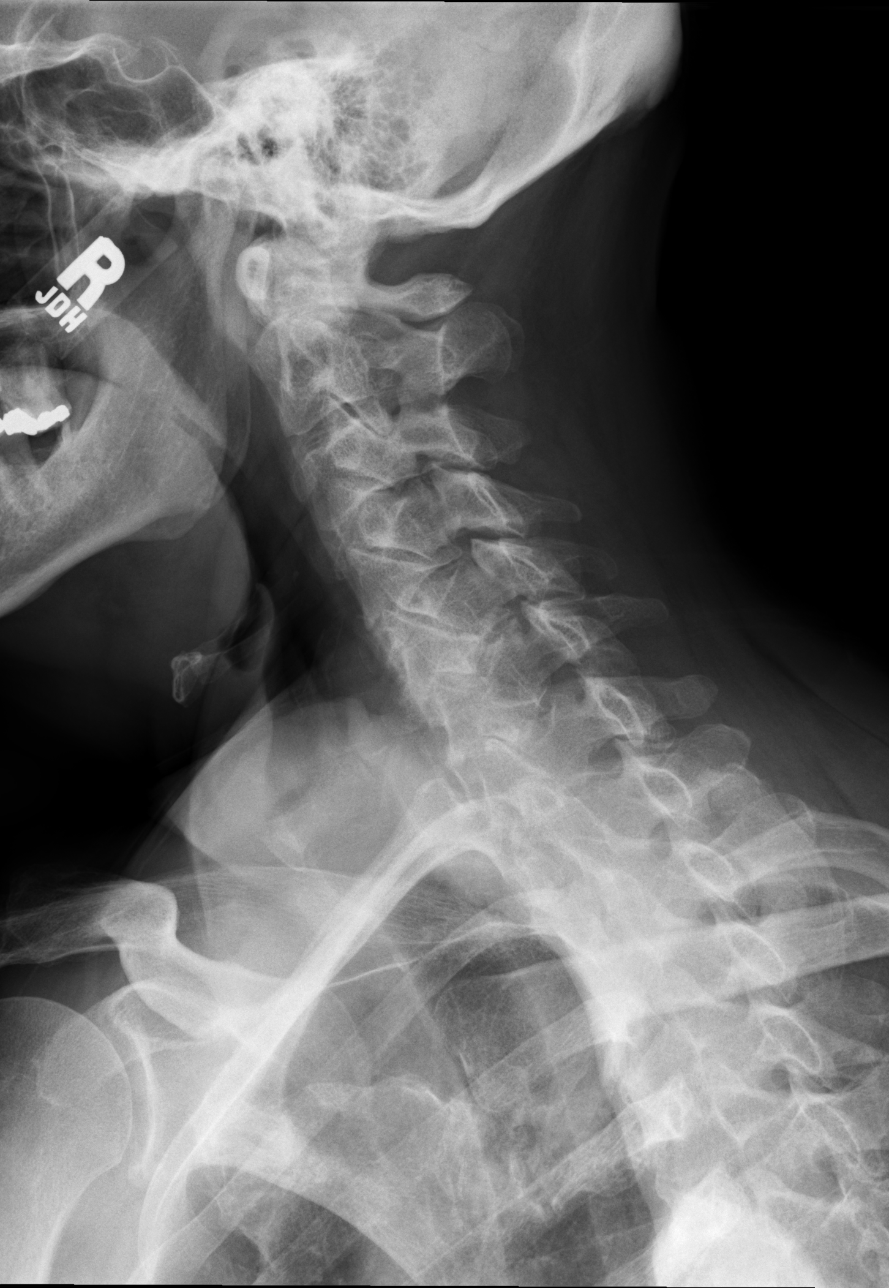

[w cervical spine ap]
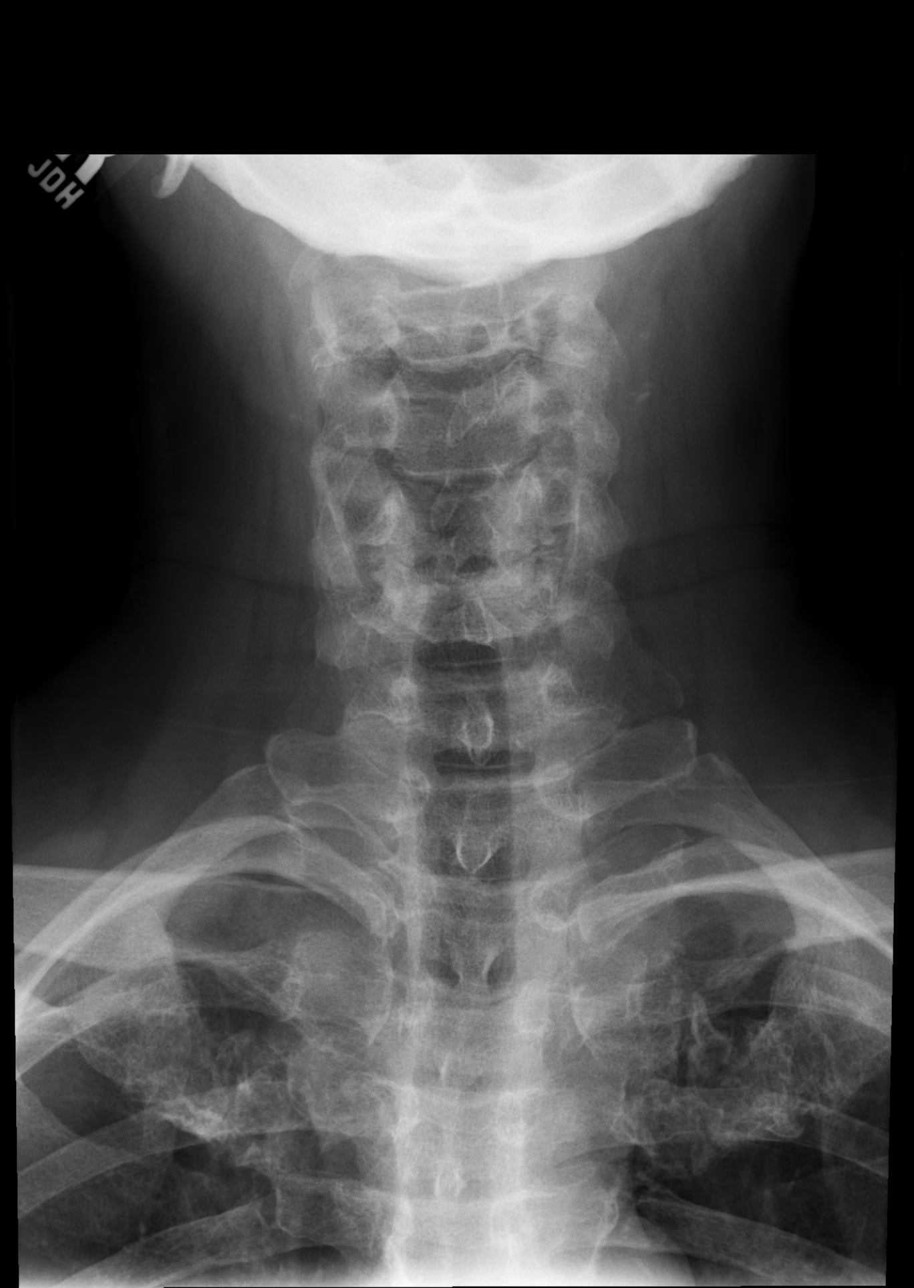

[t cervical spine odontoid (1 of 2)]
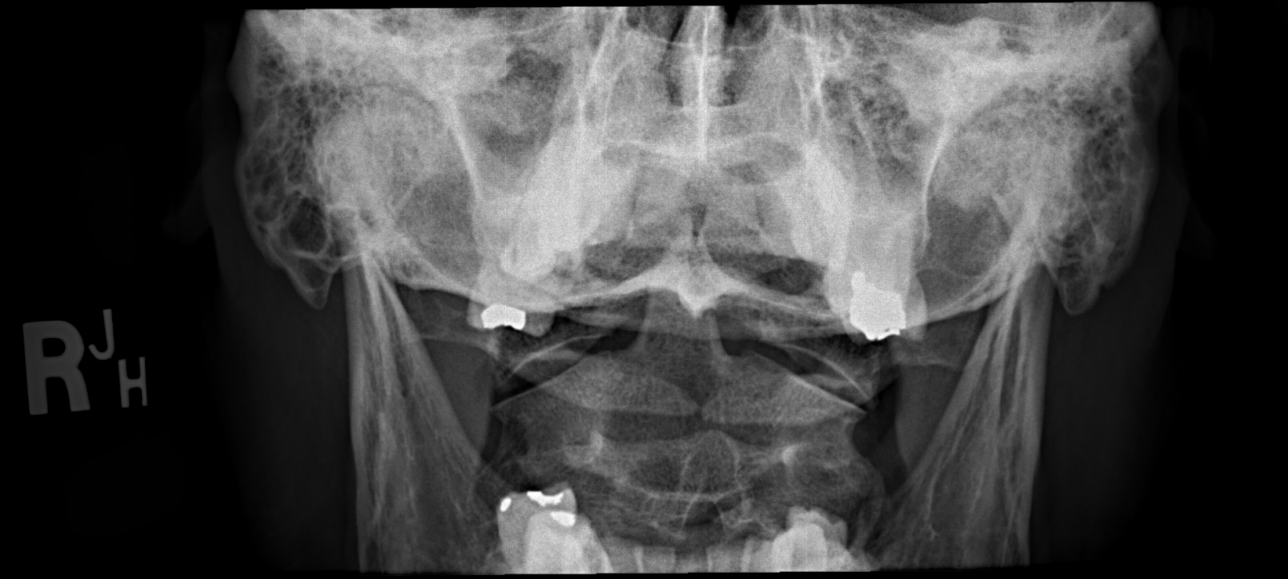

[t cervical spine odontoid (2 of 2)]
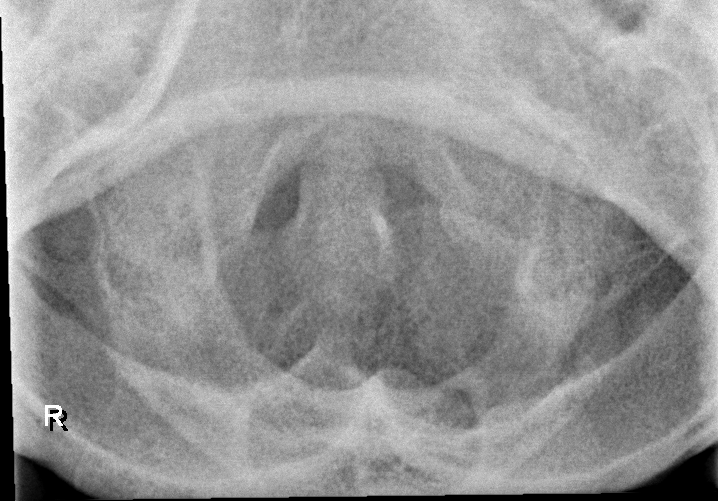

[6 of 6 positions shown; findings below may reference images not displayed]

FINDINGS: The cervical vertebral bodies are preserved in height. There is disc
space narrowing at C5-6 and at C6-7. There are anterior endplate
osteophytes from C3 inferiorly. There is no perched facet. The
oblique views reveal mild bony encroachment upon the neural foramina
at multiple levels. The spinous processes are intact. The
prevertebral soft tissue spaces are normal.
IMPRESSION: There is mild degenerative disc disease at multiple levels with the
most significant narrowing seen at C5-6 and C6-7. Mild multilevel
bilateral bony encroachment upon the neural foramina is likely due
to osteophytes from facet joint and uncovertebral joint hypertrophy.
MRI of the cervical spine may be useful.

## 2016-06-10 IMAGING — CR DG SHOULDER 2+V*L*
3 series · 3 of 3 positions shown · non-contrast
Comparison: None.

CLINICAL DATA: Two years of chronic left shoulder pain with limited
range of motion when the arm is extended above the head, pain in the
left scapular area radiating into the mid thoracic and lower
cervical spine.

EXAM:
LEFT SHOULDER - 2+ VIEW

[w shoulder grashey left]
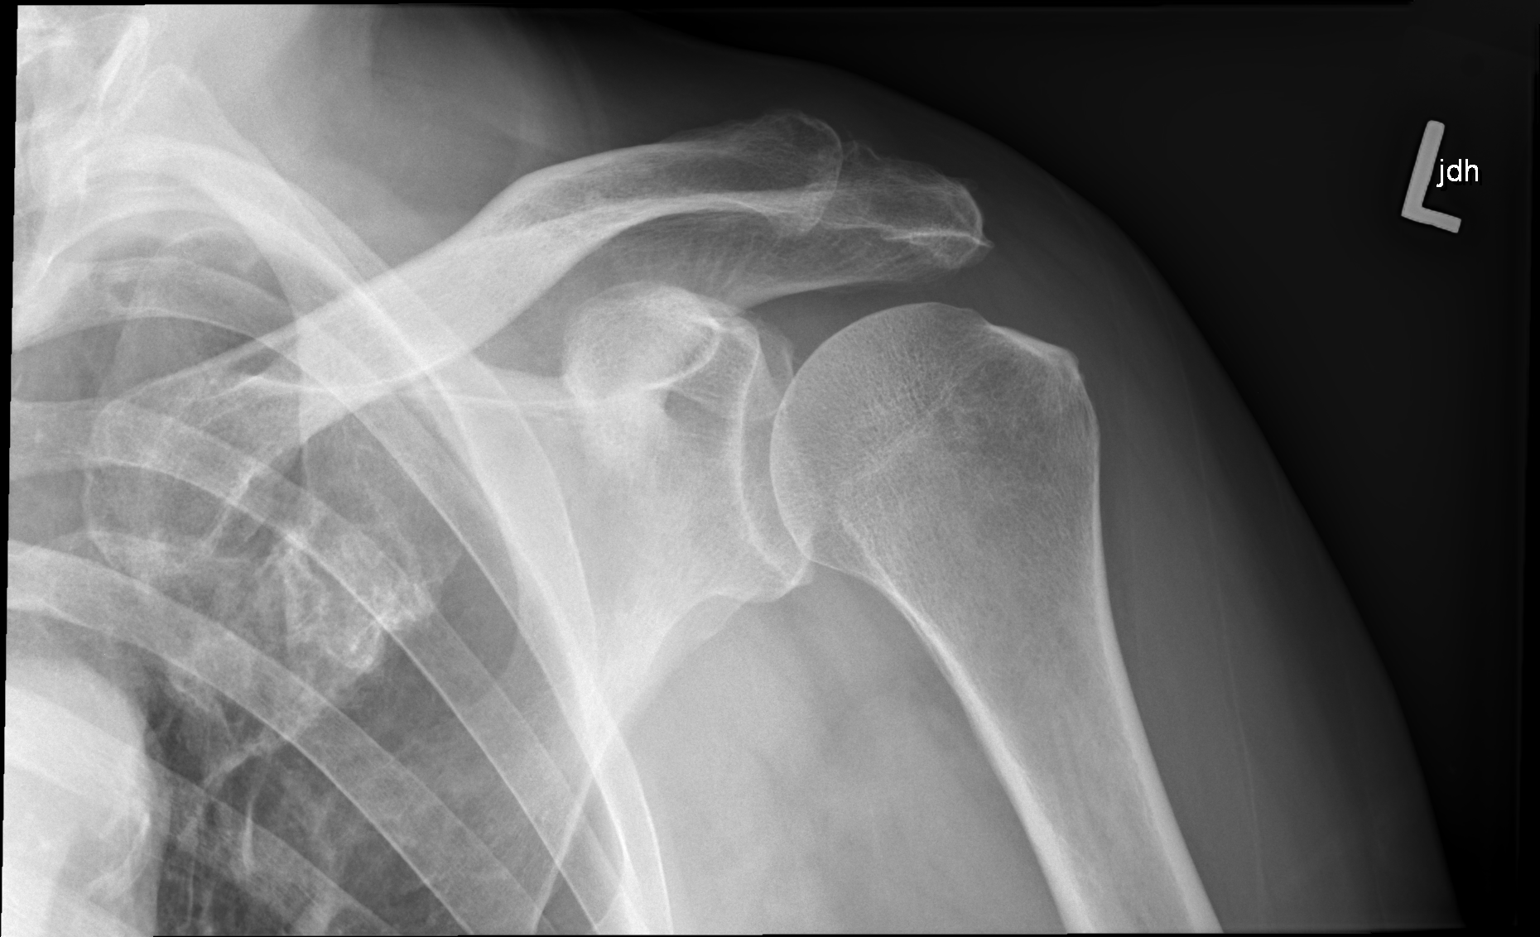

[w shoulder y-view left]
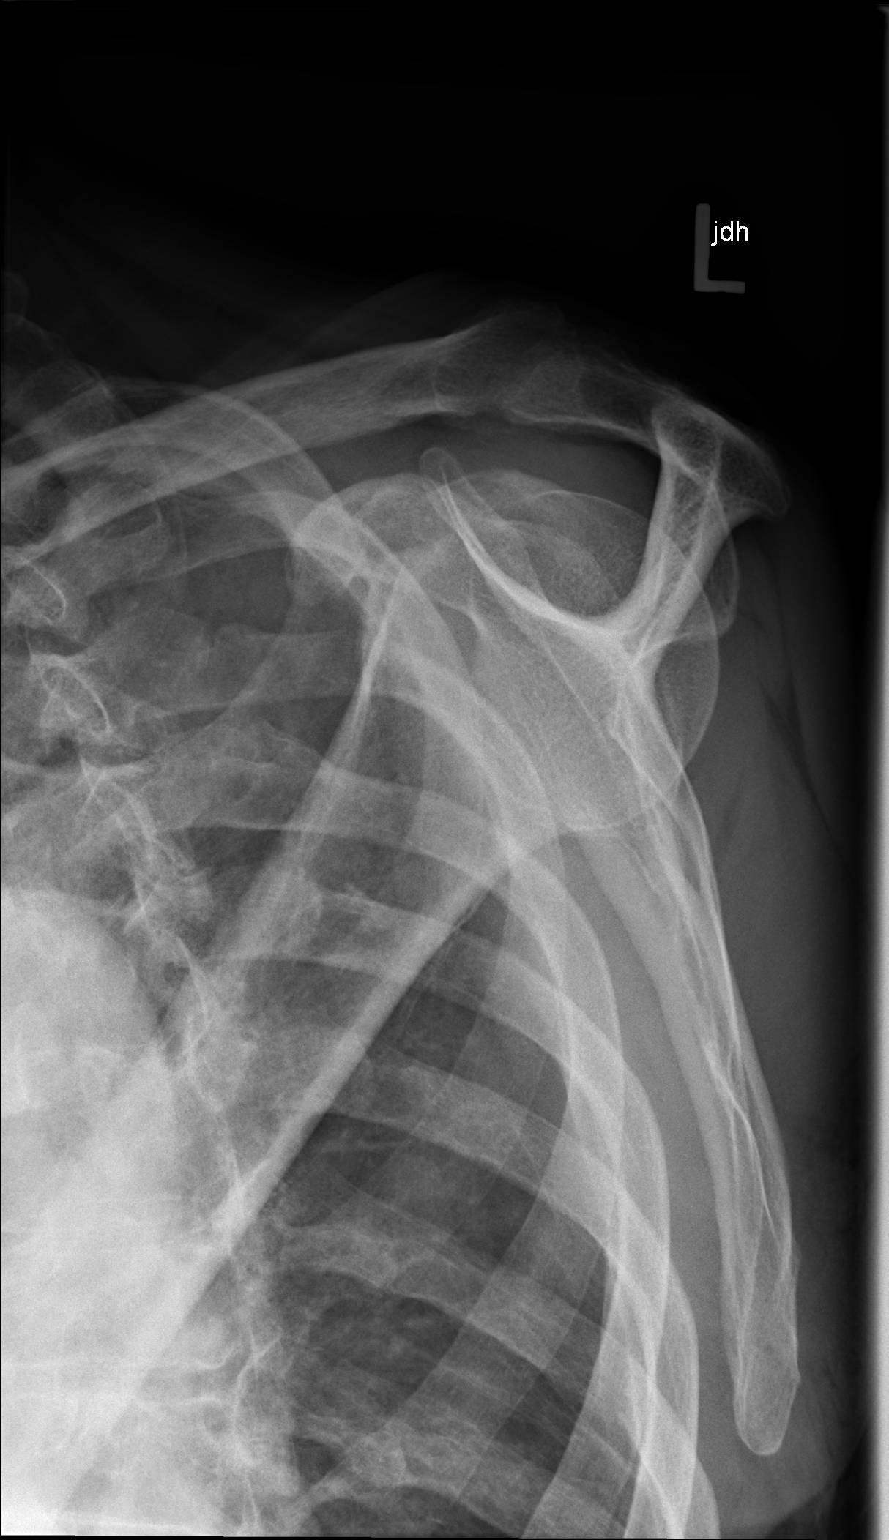

[w shoulder axillary left]
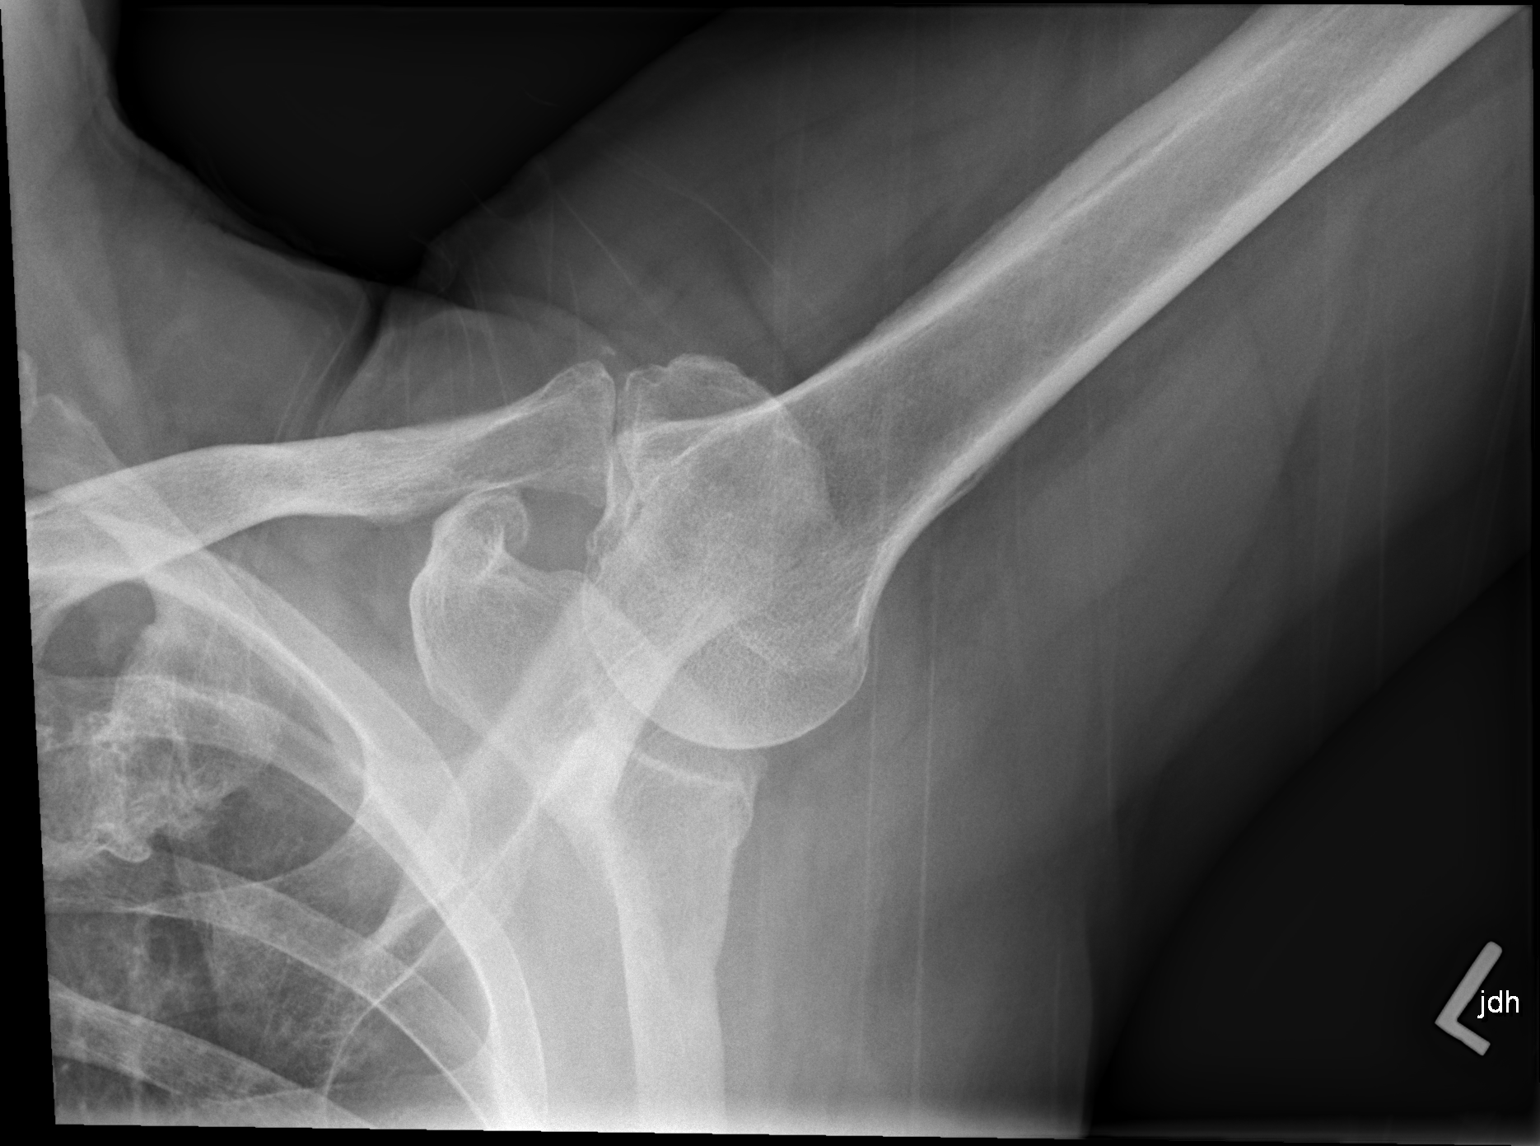

[3 of 3 positions shown; findings below may reference images not displayed]

FINDINGS: The bones of the left shoulder are adequately mineralized. The
glenohumeral joint exhibits minimal spurring from the posterior
margin of the glenoid. The humeral head remains smoothly rounded.
There is mild irregularity of the margins of the AC joint with some
narrowing of the joint space. The observed portions of the left
clavicle and upper left ribs are normal.
IMPRESSION: Mild osteoarthritic changes of the glenohumeral and AC joints. There
is no acute bony abnormality.

## 2016-10-21 ENCOUNTER — Other Ambulatory Visit: Payer: Self-pay | Admitting: Family Medicine

## 2016-10-21 ENCOUNTER — Ambulatory Visit
Admission: RE | Admit: 2016-10-21 | Discharge: 2016-10-21 | Disposition: A | Discharge status: Home / Self Care | Payer: BLUE CROSS/BLUE SHIELD | Source: Ambulatory Visit | Attending: Family Medicine | Admitting: Family Medicine

## 2016-10-21 DIAGNOSIS — R490 Dysphonia: Secondary | ICD-10-CM

## 2016-10-21 DIAGNOSIS — R05 Cough: Secondary | ICD-10-CM

## 2016-10-21 DIAGNOSIS — R053 Chronic cough: Secondary | ICD-10-CM

## 2016-10-22 ENCOUNTER — Other Ambulatory Visit: Payer: Self-pay | Admitting: Family Medicine

## 2016-10-22 DIAGNOSIS — Z77090 Contact with and (suspected) exposure to asbestos: Secondary | ICD-10-CM

## 2016-11-02 ENCOUNTER — Other Ambulatory Visit: Payer: Self-pay | Admitting: Otolaryngology

## 2016-11-02 ENCOUNTER — Ambulatory Visit
Admission: RE | Admit: 2016-11-02 | Discharge: 2016-11-02 | Disposition: A | Discharge status: Home / Self Care | Payer: BLUE CROSS/BLUE SHIELD | Source: Ambulatory Visit | Attending: Otolaryngology | Admitting: Otolaryngology

## 2016-11-02 DIAGNOSIS — R229 Localized swelling, mass and lump, unspecified: Principal | ICD-10-CM

## 2016-11-02 DIAGNOSIS — IMO0002 Reserved for concepts with insufficient information to code with codable children: Secondary | ICD-10-CM

## 2016-11-02 MED ORDER — IOPAMIDOL (ISOVUE-300) INJECTION 61%: 75.0000 mL | Freq: Once | INTRAVENOUS | Status: DC | PRN

## 2016-12-02 ENCOUNTER — Institutional Professional Consult (permissible substitution): Payer: BLUE CROSS/BLUE SHIELD | Admitting: Pulmonary Disease

## 2016-12-28 DEATH — deceased
